# Patient Record
Sex: Female | Born: 1990 | Race: White | Hispanic: No | Marital: Married | Smoking: Former smoker
Health system: Southern US, Community
[De-identification: ages and names within clinical notes are randomized; demographics above are authoritative.]

## PROBLEM LIST (undated history)

## (undated) ENCOUNTER — Inpatient Hospital Stay (HOSPITAL_COMMUNITY): Payer: Self-pay

## (undated) DIAGNOSIS — F32A Depression, unspecified: Secondary | ICD-10-CM

## (undated) DIAGNOSIS — F329 Major depressive disorder, single episode, unspecified: Secondary | ICD-10-CM

## (undated) DIAGNOSIS — F419 Anxiety disorder, unspecified: Secondary | ICD-10-CM

## (undated) DIAGNOSIS — N809 Endometriosis, unspecified: Secondary | ICD-10-CM

## (undated) HISTORY — PX: DIAGNOSTIC LAPAROSCOPY: SUR761

## (undated) HISTORY — PX: TONSILLECTOMY: SUR1361

---
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
## 2015-05-13 ENCOUNTER — Encounter (HOSPITAL_COMMUNITY): Payer: Self-pay | Admitting: *Deleted

## 2015-05-17 NOTE — H&P (Signed)
Breanna Murray is an 25 y.o. female with h/o endometriosis (l/s 2012) and recurrent pain.  The patient has been on multiple hormonal medications including continuous OCP, Nexplanon, and Depo Lupron.  She had her Nexplanon removed in December as she was planning to start a family. Her pain has returned and she desires surgical management.  She describes previously having had cystoscopy with IC diagnosed and treated and wants to have the same performed again.    Pertinent Gynecological History: Menses: with severe dysmenorrhea Bleeding: normal Contraception: none DES exposure: unknown Blood transfusions: none Sexually transmitted diseases: no past history Previous GYN Procedures: laproscopy for endometriosis  Last mammogram: n/a Date: n/a Last pap: normal Date: 10/2014 OB History: G0  Menstrual History: Menarche age: n/a Patient's last menstrual period was 04/11/2015 (exact date).    Past Medical History  Diagnosis Date  . Anxiety   . Depression     Past Surgical History  Procedure Laterality Date  . Tonsillectomy    . Diagnostic laparoscopy      History reviewed. No pertinent family history.  Social History:  reports that she quit smoking about 5 years ago. Her smoking use included Cigarettes. She has never used smokeless tobacco. She reports that she drinks alcohol. She reports that she does not use illicit drugs.  Allergies:  Allergies  Allergen Reactions  . Metaxalone Hives  . Compazine [Prochlorperazine Edisylate] Anxiety    Jumping out of body   . Dexamethasone Anxiety    No prescriptions prior to admission    Review of Systems  Gastrointestinal: Positive for abdominal pain.    Height 5\' 5"  (1.651 m), weight 63.504 kg (140 lb), last menstrual period 04/11/2015. Physical Exam  Constitutional: She is oriented to person, place, and time. She appears well-developed and well-nourished.  Cardiovascular: Normal rate and regular rhythm.   Respiratory: Effort normal  and breath sounds normal.  GI: Soft. There is no rebound and no guarding.  Neurological: She is alert and oriented to person, place, and time.  Skin: Skin is warm and dry.  Psychiatric: She has a normal mood and affect. Her behavior is normal.    No results found for this or any previous visit (from the past 24 hour(s)).  No results found.  Assessment/Plan: 25yo with h/o endometriosis and recurrent pelvic pain -Dx L/S, removal of endometriosis, chromopertubation, cysto with bladder instillation -Patient is counseled re: risk of bleeding, infection, scarring and damage to surrounding structures.  All questions were answered and the patient wishes to proceed.  Lani Havlik 05/17/2015, 1:46 PM

## 2015-05-22 ENCOUNTER — Ambulatory Visit (HOSPITAL_COMMUNITY): Payer: PRIVATE HEALTH INSURANCE | Admitting: Anesthesiology

## 2015-05-22 ENCOUNTER — Ambulatory Visit (HOSPITAL_COMMUNITY)
Admission: RE | Admit: 2015-05-22 | Discharge: 2015-05-22 | Disposition: A | Discharge status: Home / Self Care | Payer: PRIVATE HEALTH INSURANCE | Source: Ambulatory Visit | Attending: Obstetrics & Gynecology | Admitting: Obstetrics & Gynecology

## 2015-05-22 ENCOUNTER — Encounter (HOSPITAL_COMMUNITY): Payer: Self-pay | Admitting: Emergency Medicine

## 2015-05-22 ENCOUNTER — Encounter (HOSPITAL_COMMUNITY)
Admission: RE | Disposition: A | Discharge status: Home / Self Care | Payer: Self-pay | Source: Ambulatory Visit | Attending: Obstetrics & Gynecology

## 2015-05-22 DIAGNOSIS — N803 Endometriosis of pelvic peritoneum: Secondary | ICD-10-CM | POA: Insufficient documentation

## 2015-05-22 DIAGNOSIS — F418 Other specified anxiety disorders: Secondary | ICD-10-CM | POA: Diagnosis not present

## 2015-05-22 DIAGNOSIS — N946 Dysmenorrhea, unspecified: Secondary | ICD-10-CM | POA: Diagnosis not present

## 2015-05-22 DIAGNOSIS — R102 Pelvic and perineal pain: Secondary | ICD-10-CM | POA: Insufficient documentation

## 2015-05-22 DIAGNOSIS — N3010 Interstitial cystitis (chronic) without hematuria: Secondary | ICD-10-CM | POA: Insufficient documentation

## 2015-05-22 DIAGNOSIS — Z87891 Personal history of nicotine dependence: Secondary | ICD-10-CM | POA: Diagnosis not present

## 2015-05-22 DIAGNOSIS — N809 Endometriosis, unspecified: Secondary | ICD-10-CM | POA: Diagnosis present

## 2015-05-22 HISTORY — PX: CHROMOPERTUBATION: SHX6288

## 2015-05-22 HISTORY — DX: Anxiety disorder, unspecified: F41.9

## 2015-05-22 HISTORY — DX: Depression, unspecified: F32.A

## 2015-05-22 HISTORY — DX: Major depressive disorder, single episode, unspecified: F32.9

## 2015-05-22 HISTORY — PX: LAPAROSCOPY: SHX197

## 2015-05-22 LAB — CBC
HCT: 43.1 % (ref 36.0–46.0)
HEMOGLOBIN: 15 g/dL (ref 12.0–15.0)
MCH: 31.1 pg (ref 26.0–34.0)
MCHC: 34.8 g/dL (ref 30.0–36.0)
MCV: 89.2 fL (ref 78.0–100.0)
Platelets: 320 10*3/uL (ref 150–400)
RBC: 4.83 MIL/uL (ref 3.87–5.11)
RDW: 12.5 % (ref 11.5–15.5)
WBC: 5.6 10*3/uL (ref 4.0–10.5)

## 2015-05-22 LAB — PREGNANCY, URINE: PREG TEST UR: NEGATIVE

## 2015-05-22 SURGERY — LAPAROSCOPY, DIAGNOSTIC
Anesthesia: General | Site: Abdomen

## 2015-05-22 MED ORDER — CEFAZOLIN SODIUM-DEXTROSE 2-4 GM/100ML-% IV SOLN
2.0000 g | INTRAVENOUS | Status: AC
  Administered 2015-05-22: 2 g via INTRAVENOUS
  Filled 2015-05-22: qty 100

## 2015-05-22 MED ORDER — BUPIVACAINE HCL (PF) 0.25 % IJ SOLN
INTRAMUSCULAR | Status: AC
  Filled 2015-05-22: qty 30

## 2015-05-22 MED ORDER — NEOSTIGMINE METHYLSULFATE 10 MG/10ML IV SOLN
INTRAVENOUS | Status: AC
  Filled 2015-05-22: qty 1

## 2015-05-22 MED ORDER — FENTANYL CITRATE (PF) 250 MCG/5ML IJ SOLN
INTRAMUSCULAR | Status: AC
  Filled 2015-05-22: qty 5

## 2015-05-22 MED ORDER — ONDANSETRON HCL 4 MG/2ML IJ SOLN
INTRAMUSCULAR | Status: AC
  Filled 2015-05-22: qty 2

## 2015-05-22 MED ORDER — LACTATED RINGERS IR SOLN
Status: DC | PRN
Start: 2015-05-22 — End: 2015-05-22
  Administered 2015-05-22: 3000 mL

## 2015-05-22 MED ORDER — HYDROMORPHONE HCL 1 MG/ML IJ SOLN
INTRAMUSCULAR | Status: AC
  Administered 2015-05-22: 0.5 mg via INTRAVENOUS
  Filled 2015-05-22: qty 1

## 2015-05-22 MED ORDER — HEPARIN SODIUM (PORCINE) 5000 UNIT/ML IJ SOLN
Freq: Once | INTRAMUSCULAR | Status: DC
  Filled 2015-05-22: qty 2

## 2015-05-22 MED ORDER — FENTANYL CITRATE (PF) 100 MCG/2ML IJ SOLN
INTRAMUSCULAR | Status: DC | PRN
  Administered 2015-05-22: 100 ug via INTRAVENOUS
  Administered 2015-05-22 (×3): 50 ug via INTRAVENOUS
  Administered 2015-05-22: 100 ug via INTRAVENOUS

## 2015-05-22 MED ORDER — GLYCOPYRROLATE 0.2 MG/ML IJ SOLN
INTRAMUSCULAR | Status: AC
  Filled 2015-05-22: qty 2

## 2015-05-22 MED ORDER — MIDAZOLAM HCL 5 MG/5ML IJ SOLN
INTRAMUSCULAR | Status: DC | PRN
  Administered 2015-05-22: 2 mg via INTRAVENOUS

## 2015-05-22 MED ORDER — SODIUM CHLORIDE 0.9 % IJ SOLN
INTRAMUSCULAR | Status: DC | PRN
  Administered 2015-05-22: 10 mL

## 2015-05-22 MED ORDER — SODIUM CHLORIDE 0.9 % IJ SOLN
INTRAMUSCULAR | Status: AC
  Filled 2015-05-22: qty 10

## 2015-05-22 MED ORDER — HYDROMORPHONE HCL 1 MG/ML IJ SOLN
0.2500 mg | INTRAMUSCULAR | Status: DC | PRN
  Administered 2015-05-22 (×2): 0.5 mg via INTRAVENOUS

## 2015-05-22 MED ORDER — LIDOCAINE HCL (CARDIAC) 20 MG/ML IV SOLN
INTRAVENOUS | Status: AC
  Filled 2015-05-22: qty 5

## 2015-05-22 MED ORDER — OXYCODONE HCL 5 MG PO TABS
5.0000 mg | ORAL_TABLET | Freq: Once | ORAL | Status: AC | PRN
  Administered 2015-05-22: 5 mg via ORAL

## 2015-05-22 MED ORDER — PROPOFOL 10 MG/ML IV BOLUS
INTRAVENOUS | Status: AC
  Filled 2015-05-22: qty 20

## 2015-05-22 MED ORDER — METHYLENE BLUE 1 % INJ SOLN
INTRAMUSCULAR | Status: AC
  Filled 2015-05-22: qty 1

## 2015-05-22 MED ORDER — PROPOFOL 10 MG/ML IV BOLUS
INTRAVENOUS | Status: DC | PRN
  Administered 2015-05-22: 200 mg via INTRAVENOUS

## 2015-05-22 MED ORDER — STERILE WATER FOR IRRIGATION IR SOLN
Status: DC | PRN
  Administered 2015-05-22: 60 mL

## 2015-05-22 MED ORDER — FENTANYL CITRATE (PF) 100 MCG/2ML IJ SOLN
INTRAMUSCULAR | Status: AC
  Filled 2015-05-22: qty 2

## 2015-05-22 MED ORDER — ONDANSETRON HCL 4 MG/2ML IJ SOLN
INTRAMUSCULAR | Status: DC | PRN
  Administered 2015-05-22: 4 mg via INTRAVENOUS

## 2015-05-22 MED ORDER — LACTATED RINGERS IV SOLN
INTRAVENOUS | Status: DC
  Administered 2015-05-22 (×2): via INTRAVENOUS

## 2015-05-22 MED ORDER — KETOROLAC TROMETHAMINE 30 MG/ML IJ SOLN
INTRAMUSCULAR | Status: AC
  Filled 2015-05-22: qty 1

## 2015-05-22 MED ORDER — LIDOCAINE HCL (CARDIAC) 20 MG/ML IV SOLN
INTRAVENOUS | Status: DC | PRN
  Administered 2015-05-22: 100 mg via INTRAVENOUS

## 2015-05-22 MED ORDER — LACTATED RINGERS IV SOLN: INTRAVENOUS | Status: DC

## 2015-05-22 MED ORDER — OXYCODONE HCL 5 MG PO TABS
ORAL_TABLET | ORAL | Status: AC
  Filled 2015-05-22: qty 1

## 2015-05-22 MED ORDER — OXYCODONE-ACETAMINOPHEN 5-325 MG PO TABS: 1.0000 | ORAL_TABLET | ORAL | Status: DC | PRN

## 2015-05-22 MED ORDER — ROCURONIUM BROMIDE 100 MG/10ML IV SOLN
INTRAVENOUS | Status: DC | PRN
  Administered 2015-05-22: 40 mg via INTRAVENOUS

## 2015-05-22 MED ORDER — IBUPROFEN 800 MG PO TABS: 800.0000 mg | ORAL_TABLET | Freq: Four times a day (QID) | ORAL | Status: DC | PRN

## 2015-05-22 MED ORDER — ACETAMINOPHEN 325 MG PO TABS: 325.0000 mg | ORAL_TABLET | ORAL | Status: DC | PRN

## 2015-05-22 MED ORDER — CEFAZOLIN SODIUM-DEXTROSE 2-3 GM-% IV SOLR
INTRAVENOUS | Status: AC
  Filled 2015-05-22: qty 50

## 2015-05-22 MED ORDER — BUPIVACAINE HCL (PF) 0.25 % IJ SOLN
INTRAMUSCULAR | Status: DC | PRN
  Administered 2015-05-22: 8 mL

## 2015-05-22 MED ORDER — SCOPOLAMINE 1 MG/3DAYS TD PT72
1.0000 | MEDICATED_PATCH | Freq: Once | TRANSDERMAL | Status: DC
  Administered 2015-05-22: 1.5 mg via TRANSDERMAL

## 2015-05-22 MED ORDER — KETOROLAC TROMETHAMINE 30 MG/ML IJ SOLN
INTRAMUSCULAR | Status: DC | PRN
  Administered 2015-05-22: 30 mg via INTRAVENOUS

## 2015-05-22 MED ORDER — ACETAMINOPHEN 160 MG/5ML PO SOLN: 325.0000 mg | ORAL | Status: DC | PRN

## 2015-05-22 MED ORDER — MIDAZOLAM HCL 2 MG/2ML IJ SOLN
INTRAMUSCULAR | Status: AC
  Filled 2015-05-22: qty 2

## 2015-05-22 MED ORDER — OXYCODONE HCL 5 MG/5ML PO SOLN: 5.0000 mg | Freq: Once | ORAL | Status: AC | PRN

## 2015-05-22 MED ORDER — ROCURONIUM BROMIDE 100 MG/10ML IV SOLN
INTRAVENOUS | Status: AC
  Filled 2015-05-22: qty 1

## 2015-05-22 MED ORDER — SCOPOLAMINE 1 MG/3DAYS TD PT72
MEDICATED_PATCH | TRANSDERMAL | Status: AC
  Administered 2015-05-22: 1.5 mg via TRANSDERMAL
  Filled 2015-05-22: qty 1

## 2015-05-22 MED ORDER — NEOSTIGMINE METHYLSULFATE 10 MG/10ML IV SOLN
INTRAVENOUS | Status: DC | PRN
  Administered 2015-05-22: 3 mg via INTRAVENOUS

## 2015-05-22 MED ORDER — GLYCOPYRROLATE 0.2 MG/ML IJ SOLN
INTRAMUSCULAR | Status: DC | PRN
  Administered 2015-05-22: 0.4 mg via INTRAVENOUS

## 2015-05-22 MED ORDER — METHYLPREDNISOLONE SODIUM SUCC 125 MG IJ SOLR
INTRAMUSCULAR | Status: DC | PRN
  Administered 2015-05-22: 125 mg

## 2015-05-22 SURGICAL SUPPLY — 33 items
BARRIER ADHS 3X4 INTERCEED (GAUZE/BANDAGES/DRESSINGS) IMPLANT
CABLE HIGH FREQUENCY MONO STRZ (ELECTRODE) ×5 IMPLANT
CATH ROBINSON RED A/P 16FR (CATHETERS) ×15 IMPLANT
CHLORAPREP W/TINT 26ML (MISCELLANEOUS) ×5 IMPLANT
CLOSURE WOUND 1/2 X4 (GAUZE/BANDAGES/DRESSINGS)
CLOTH BEACON ORANGE TIMEOUT ST (SAFETY) ×5 IMPLANT
DRSG COVADERM PLUS 2X2 (GAUZE/BANDAGES/DRESSINGS) ×10 IMPLANT
DRSG OPSITE POSTOP 3X4 (GAUZE/BANDAGES/DRESSINGS) ×5 IMPLANT
FORCEPS CUTTING 33CM 5MM (CUTTING FORCEPS) IMPLANT
GLOVE BIO SURGEON STRL SZ 6 (GLOVE) ×5 IMPLANT
GLOVE BIOGEL PI IND STRL 6 (GLOVE) ×6 IMPLANT
GLOVE BIOGEL PI IND STRL 7.0 (GLOVE) ×3 IMPLANT
GLOVE BIOGEL PI INDICATOR 6 (GLOVE) ×4
GLOVE BIOGEL PI INDICATOR 7.0 (GLOVE) ×2
GOWN STRL REUS W/TWL LRG LVL3 (GOWN DISPOSABLE) ×15 IMPLANT
LIQUID BAND (GAUZE/BANDAGES/DRESSINGS) ×5 IMPLANT
NEEDLE INSUFFLATION 120MM (ENDOMECHANICALS) ×5 IMPLANT
NS IRRIG 1000ML POUR BTL (IV SOLUTION) ×5 IMPLANT
PACK LAPAROSCOPY BASIN (CUSTOM PROCEDURE TRAY) IMPLANT
PAD TRENDELENBURG POSITION (MISCELLANEOUS) ×5 IMPLANT
POUCH SPECIMEN RETRIEVAL 10MM (ENDOMECHANICALS) IMPLANT
SET CYSTO W/LG BORE CLAMP LF (SET/KITS/TRAYS/PACK) ×5 IMPLANT
SET IRRIG TUBING LAPAROSCOPIC (IRRIGATION / IRRIGATOR) ×5 IMPLANT
SLEEVE XCEL OPT CAN 5 100 (ENDOMECHANICALS) ×5 IMPLANT
STRIP CLOSURE SKIN 1/2X4 (GAUZE/BANDAGES/DRESSINGS) IMPLANT
SUT MNCRL AB 3-0 PS2 27 (SUTURE) ×5 IMPLANT
SUT VICRYL 0 UR6 27IN ABS (SUTURE) ×5 IMPLANT
SYRINGE IRR TOOMEY STRL 70CC (SYRINGE) ×5 IMPLANT
TOWEL OR 17X24 6PK STRL BLUE (TOWEL DISPOSABLE) ×10 IMPLANT
TROCAR XCEL NON-BLD 11X100MML (ENDOMECHANICALS) ×5 IMPLANT
TROCAR XCEL NON-BLD 5MMX100MML (ENDOMECHANICALS) ×5 IMPLANT
WARMER LAPAROSCOPE (MISCELLANEOUS) ×5 IMPLANT
WATER STERILE IRR 1000ML POUR (IV SOLUTION) ×5 IMPLANT

## 2015-05-22 NOTE — Progress Notes (Signed)
No change to H&P.  Will not do cystoscopy as patient has previous dx of IC.  Will plan to do bladder instillation.    Mitchel HonourMegan Dailyn Kempner, DO

## 2015-05-22 NOTE — Op Note (Signed)
Milon DikesHayden G Appleman 05/22/2015 PREOPERATIVE DIAGNOSIS:  History of endometriosis and interstitial cystitis, recurrent pain  POSTOPERATIVE DIAGNOSIS:  SAA  PROCEDURE:  Laparoscopic removal of endometriosis, chromopertubation, bladder instillation  ANESTHESIA:  General endotracheal  COMPLICATIONS:  None immediate.  PATHOLOGY:  Peritoneal biopsy  ESTIMATED BLOOD LOSS:  Less than 20 ml.  INDICATIONS: 25 y.o.  with history of endometriosis and interstitial cystitis and recurrent pelvic pain.      FINDINGS:  Normal uterus, bilateral fallopian tubes, and ovaries.  Normal appendix and gallbladder edge.  Gunpowder endometriosis implants on peritoneum overlying bladder, left uterosacral ligament and left utero-ovarian fossa; all superficial.  Bilateral spill of dye from fallopian tubes.  TECHNIQUE:  The patient was taken to the operating room where general anesthesia was obtained without difficulty.  She was then placed in the dorsal lithotomy position and prepared and draped in sterile fashion.  After an adequate timeout was performed, a bivalved speculum was then placed in the patient's vagina, and the anterior lip of cervix grasped with the single-tooth tenaculum.  The acorn uterine manipulator was advanced into the uterus.  The speculum was removed from the vagina.  The bladder was drained using a red rubber catheter of approximately 25cc of clear urine.  The bladder instillation solution was prepared by the pharmacy of  1 percent lidocaine 20 cc, 125 mg Solu-Medrol (2 cc solution), 10,000 units heparin (1000 u/ml, 10 ml vial to total 10,000 units), and 8.4 percent sodium bicarbonate 20 cc.This solution was instilled into the bladder via a catheter.  The solution was drained after 30 minutes.  No hematuria was noted.    Attention was then turned to the patient's abdomen where a 10-mm skin incision was made on the umbilical fold.  The Veress needle was carefully introduced into the peritoneal cavity  through the abdominal wall.  Intraperitoneal placement was confirmed by drop in intraabdominal pressure with insufflation of carbon dioxide gas.  Adequate pneumoperitoneum was obtained, and the 10mm trocar was then advanced without difficulty into the abdomen where intraabdominal placement was confirmed by the operative laparoscope.  The above listed findings were noted.  A second incision was made in the suprapubic region measuring 5mm.  The 5mm trocar was advanced under direct visualization of the scope.  Using a biopsy forceps, a biopsy was obtained from the bladder peritoneum.  The biopsy site was rendered hemostatic using monopolar shears.  The remaining lesions were cauterized using the monopolar shears.    Attention was then turned to the chromopertubation.  A dilute solution of methylene blue was pushed through the uterine manipulation while watching the fallopian tubes laparoscopically.  Spill bilaterally was observed.  Using a suction irrigator, the spilled dye and was removed from the pelvis.  Instruments were removed from the abdomen and trocars were removed as well.  The infraumbilical fascial incision was closed with 0 Vicryl in a figure of eight stitch.  The skin incisions were closed with 3-0 monocryl in subcuticular fashion.  Dermabond was applied   The uterine manipulator and the tenaculum were removed from the vagina without complications. The patient tolerated the procedure well.  Sponge, lap, and needle counts were correct times two.  The patient was then taken to the recovery room awake, extubated and in stable  in stable condition.

## 2015-05-22 NOTE — Transfer of Care (Signed)
Immediate Anesthesia Transfer of Care Note  Patient: Breanna Murray  Procedure(s) Performed: Procedure(s): LAPAROSCOPY DIAGNOSTIC, removal of endometriosis, Bladder Instillation (N/A) CHROMOPERTUBATION (Bilateral)  Patient Location: PACU  Anesthesia Type:General  Level of Consciousness: awake, alert , oriented and patient cooperative  Airway & Oxygen Therapy: Patient Spontanous Breathing and Patient connected to nasal cannula oxygen  Post-op Assessment: Report given to RN and Post -op Vital signs reviewed and stable  Post vital signs: Reviewed and stable  Last Vitals:  Filed Vitals:   05/22/15 1238  BP: 114/79  Pulse: 85  Temp: 36.8 C  Resp: 20    Complications: No apparent anesthesia complications

## 2015-05-22 NOTE — Discharge Instructions (Signed)
Call MD for T>100.4, severe abdominal pain, intractable nausea and/or vomiting, or respiratory distress.  Call office to schedule postop appointment in 2 weeks.  No driving while taking narcotics.  Pelvic rest x 2 weeks.DISCHARGE INSTRUCTIONS: Laparoscopy  The following instructions have been prepared to help you care for yourself upon your return home today.  Wound care:  Do not get the incision wet for the first 24 hours. The incision should be kept clean and dry.  The Band-Aids or dressings may be removed the day after surgery.  Should the incision become sore, red, and swollen after the first week, check with your doctor.  Personal hygiene:  Shower the day after your procedure.  Activity and limitations:  Do NOT drive or operate any equipment today.  Do NOT lift anything more than 15 pounds for 2-3 weeks after surgery.  Do NOT rest in bed all day.  Walking is encouraged. Walk each day, starting slowly with 5-minute walks 3 or 4 times a day. Slowly increase the length of your walks.  Walk up and down stairs slowly.  Do NOT do strenuous activities, such as golfing, playing tennis, bowling, running, biking, weight lifting, gardening, mowing, or vacuuming for 2-4 weeks. Ask your doctor when it is okay to start.  Diet: Eat a light meal as desired this evening. You may resume your usual diet tomorrow.  Return to work: This is dependent on the type of work you do. For the most part you can return to a desk job within a week of surgery. If you are more active at work, please discuss this with your doctor.  What to expect after your surgery: You may have a slight burning sensation when you urinate on the first day. You may have a very small amount of blood in the urine. Expect to have a small amount of vaginal discharge/light bleeding for 1-2 weeks. It is not unusual to have abdominal soreness and bruising for up to 2 weeks. You may be tired and need more rest for about 1 week. You may  experience shoulder pain for 24-72 hours. Lying flat in bed may relieve it.  Call your doctor for any of the following:  Develop a fever of 100.4 or greater  Inability to urinate 6 hours after discharge from hospital  Severe pain not relieved by pain medications  Persistent of heavy bleeding at incision site  Redness or swelling around incision site after a week  Increasing nausea or vomiting  Patient Signature________________________________________ Nurse Signature_________________________________________

## 2015-05-22 NOTE — Anesthesia Preprocedure Evaluation (Signed)
Anesthesia Evaluation  Patient identified by MRN, date of birth, ID band Patient awake    Reviewed: Allergy & Precautions, NPO status , Patient's Chart, lab work & pertinent test results  History of Anesthesia Complications Negative for: history of anesthetic complications  Airway Mallampati: I  TM Distance: >3 FB Neck ROM: Full    Dental  (+) Teeth Intact   Pulmonary neg shortness of breath, neg sleep apnea, neg COPD, neg recent URI, former smoker,    breath sounds clear to auscultation       Cardiovascular negative cardio ROS   Rhythm:Regular     Neuro/Psych PSYCHIATRIC DISORDERS Anxiety Depression    GI/Hepatic negative GI ROS, Neg liver ROS,   Endo/Other  negative endocrine ROS  Renal/GU negative Renal ROS     Musculoskeletal   Abdominal   Peds  Hematology negative hematology ROS (+)   Anesthesia Other Findings   Reproductive/Obstetrics                             Anesthesia Physical Anesthesia Plan  ASA: II  Anesthesia Plan: General   Post-op Pain Management:    Induction: Intravenous  Airway Management Planned: Oral ETT  Additional Equipment: None  Intra-op Plan:   Post-operative Plan: Extubation in OR  Informed Consent: I have reviewed the patients History and Physical, chart, labs and discussed the procedure including the risks, benefits and alternatives for the proposed anesthesia with the patient or authorized representative who has indicated his/her understanding and acceptance.   Dental advisory given  Plan Discussed with: CRNA and Surgeon  Anesthesia Plan Comments:         Anesthesia Quick Evaluation

## 2015-05-23 NOTE — Anesthesia Postprocedure Evaluation (Signed)
Anesthesia Post Note  Patient: Breanna Murray  Procedure(s) Performed: Procedure(s) (LRB): LAPAROSCOPY DIAGNOSTIC, removal of endometriosis, Bladder Instillation (N/A) CHROMOPERTUBATION (Bilateral)  Patient location during evaluation: PACU Anesthesia Type: General Level of consciousness: awake Pain management: pain level controlled Vital Signs Assessment: post-procedure vital signs reviewed and stable Respiratory status: spontaneous breathing and respiratory function stable Cardiovascular status: stable Postop Assessment: no signs of nausea or vomiting Anesthetic complications: no    Last Vitals:  Filed Vitals:   05/22/15 1635 05/22/15 1641  BP:    Pulse: 63 77  Temp:  36.9 C  Resp: 12 16    Last Pain:  Filed Vitals:   05/22/15 1644  PainSc: 3                  Lott Seelbach

## 2015-05-25 ENCOUNTER — Encounter (HOSPITAL_COMMUNITY): Payer: Self-pay | Admitting: Obstetrics & Gynecology

## 2017-01-25 ENCOUNTER — Inpatient Hospital Stay (HOSPITAL_COMMUNITY)
Admission: AD | Admit: 2017-01-25 | Discharge: 2017-01-25 | Disposition: A | Discharge status: Home / Self Care | Payer: PRIVATE HEALTH INSURANCE | Source: Ambulatory Visit | Attending: Obstetrics and Gynecology | Admitting: Obstetrics and Gynecology

## 2017-01-25 ENCOUNTER — Encounter (HOSPITAL_COMMUNITY): Payer: Self-pay | Admitting: *Deleted

## 2017-01-25 ENCOUNTER — Inpatient Hospital Stay (HOSPITAL_COMMUNITY): Payer: PRIVATE HEALTH INSURANCE

## 2017-01-25 DIAGNOSIS — Z3A16 16 weeks gestation of pregnancy: Secondary | ICD-10-CM | POA: Diagnosis not present

## 2017-01-25 DIAGNOSIS — Z79899 Other long term (current) drug therapy: Secondary | ICD-10-CM | POA: Diagnosis not present

## 2017-01-25 DIAGNOSIS — O26892 Other specified pregnancy related conditions, second trimester: Secondary | ICD-10-CM | POA: Insufficient documentation

## 2017-01-25 DIAGNOSIS — Z87891 Personal history of nicotine dependence: Secondary | ICD-10-CM | POA: Insufficient documentation

## 2017-01-25 DIAGNOSIS — O4692 Antepartum hemorrhage, unspecified, second trimester: Secondary | ICD-10-CM

## 2017-01-25 DIAGNOSIS — F329 Major depressive disorder, single episode, unspecified: Secondary | ICD-10-CM | POA: Diagnosis not present

## 2017-01-25 DIAGNOSIS — R102 Pelvic and perineal pain: Secondary | ICD-10-CM | POA: Diagnosis not present

## 2017-01-25 DIAGNOSIS — F419 Anxiety disorder, unspecified: Secondary | ICD-10-CM | POA: Insufficient documentation

## 2017-01-25 DIAGNOSIS — Z888 Allergy status to other drugs, medicaments and biological substances status: Secondary | ICD-10-CM | POA: Insufficient documentation

## 2017-01-25 DIAGNOSIS — O209 Hemorrhage in early pregnancy, unspecified: Secondary | ICD-10-CM | POA: Diagnosis not present

## 2017-01-25 DIAGNOSIS — N939 Abnormal uterine and vaginal bleeding, unspecified: Secondary | ICD-10-CM | POA: Diagnosis present

## 2017-01-25 HISTORY — DX: Endometriosis, unspecified: N80.9

## 2017-01-25 LAB — URINALYSIS, ROUTINE W REFLEX MICROSCOPIC
Bilirubin Urine: NEGATIVE
GLUCOSE, UA: NEGATIVE mg/dL
KETONES UR: NEGATIVE mg/dL
Leukocytes, UA: NEGATIVE
NITRITE: NEGATIVE
PROTEIN: 30 mg/dL — AB
Specific Gravity, Urine: 1.023 (ref 1.005–1.030)
pH: 7 (ref 5.0–8.0)

## 2017-01-25 LAB — CBC
HCT: 40.2 % (ref 36.0–46.0)
HEMOGLOBIN: 13.4 g/dL (ref 12.0–15.0)
MCH: 29 pg (ref 26.0–34.0)
MCHC: 33.3 g/dL (ref 30.0–36.0)
MCV: 87 fL (ref 78.0–100.0)
Platelets: 259 10*3/uL (ref 150–400)
RBC: 4.62 MIL/uL (ref 3.87–5.11)
RDW: 12.7 % (ref 11.5–15.5)
WBC: 6.8 10*3/uL (ref 4.0–10.5)

## 2017-01-25 LAB — WET PREP, GENITAL
Clue Cells Wet Prep HPF POC: NONE SEEN
SPERM: NONE SEEN
TRICH WET PREP: NONE SEEN
YEAST WET PREP: NONE SEEN

## 2017-01-25 NOTE — MAU Provider Note (Signed)
History     CSN: [id number]  Arrival date and time: 01/25/17 [id number]   First Provider Initiated Contact with Patient 01/25/17 0825      Chief Complaint  Patient presents with  . Vaginal Bleeding   Breanna Murray is a 26 y.o. G1P0 at 5276w3d who presents today with vaginal bleeding. Denies complications with the pregnancy. Next OB visit 02/10/17.    Vaginal Bleeding  The patient's primary symptoms include pelvic pain and vaginal bleeding. This is a new problem. The current episode started today (0500). The problem occurs constantly. The problem has been gradually improving. Pain severity now: 2/10. The problem affects both sides. She is pregnant. Pertinent negatives include no chills, fever, nausea or vomiting. The vaginal bleeding is heavier than menses. She has been passing clots. She has not been passing tissue. Nothing aggravates the symptoms. She has tried nothing for the symptoms. She is sexually active (Last intercourse 01/22/17). No, her partner does not have an STD.    Past Medical History:  Diagnosis Date  . Anxiety   . Depression   . Endometriosis     Past Surgical History:  Procedure Laterality Date  . CHROMOPERTUBATION Bilateral 05/22/2015   Procedure: CHROMOPERTUBATION;  Surgeon: Mitchel HonourMegan Morris, DO;  Location: [facility];  Service: Gynecology;  Laterality: Bilateral;  . DIAGNOSTIC LAPAROSCOPY    . LAPAROSCOPY N/A 05/22/2015   Procedure: LAPAROSCOPY DIAGNOSTIC, removal of endometriosis, Bladder Instillation;  Surgeon: Mitchel HonourMegan Morris, DO;  Location: [facility];  Service: Gynecology;  Laterality: N/A;  . TONSILLECTOMY      History reviewed. No pertinent family history.  Social History   Tobacco Use  . Smoking status: Former Smoker    Types: Cigarettes    Last attempt to quit: 05/13/2010    Years since quitting: 6.7  . Smokeless tobacco: Never Used  Substance Use Topics  . Alcohol use: Yes    Comment: 2 drinks per week  . Drug use: No    Allergies:  Allergies  Allergen  Reactions  . Metaxalone Hives  . Compazine [Prochlorperazine Edisylate] Anxiety    Jumping out of body   . Dexamethasone Anxiety    Medications Prior to Admission  Medication Sig Dispense Refill Last Dose  . acidophilus (RISAQUAD) CAPS capsule Take 1 capsule by mouth daily.     Marland Kitchen. escitalopram (LEXAPRO) 10 MG tablet Take 10 mg by mouth daily.     Marland Kitchen. ibuprofen (ADVIL,MOTRIN) 800 MG tablet Take 1 tablet (800 mg total) by mouth every 6 (six) hours as needed. 30 tablet 1   . oxyCODONE-acetaminophen (ROXICET) 5-325 MG tablet Take 1-2 tablets by mouth every 4 (four) hours as needed for severe pain. 20 tablet 0     Review of Systems  Constitutional: Negative for chills and fever.  Gastrointestinal: Negative for nausea and vomiting.  Genitourinary: Positive for pelvic pain and vaginal bleeding.   Physical Exam   Blood pressure 126/82, pulse 88, temperature 98.3 F (36.8 C), temperature source Oral, resp. rate 15, height 5\' 5"  (1.651 m), weight 161 lb (73 kg), SpO2 100 %.  Physical Exam  Nursing note and vitals reviewed. Constitutional: She is oriented to person, place, and time. She appears well-developed and well-nourished. No distress.  HENT:  Head: Normocephalic.  Cardiovascular: Normal rate.  Respiratory: Effort normal.  GI: Soft. There is no tenderness. There is no rebound.  Genitourinary:  Genitourinary Comments:  External: no lesion Vagina: small amount of bright red blood in the vagina  Cervix: pink, smooth, visually closed, no  active bleeding noted. Closed by digital exam  Uterus: AGA, FHT 152 with doppler   Neurological: She is alert and oriented to person, place, and time.  Skin: Skin is warm and dry.  Psychiatric: She has a normal mood and affect.   Results for orders placed or performed during the hospital encounter of 01/25/17 (from the past 24 hour(s))  Urinalysis, Routine w reflex microscopic     Status: Abnormal   Collection Time: 01/25/17  8:05 AM  Result Value  Ref Range   Color, Urine YELLOW YELLOW   APPearance CLOUDY (A) CLEAR   Specific Gravity, Urine 1.023 1.005 - 1.030   pH 7.0 5.0 - 8.0   Glucose, UA NEGATIVE NEGATIVE mg/dL   Hgb urine dipstick LARGE (A) NEGATIVE   Bilirubin Urine NEGATIVE NEGATIVE   Ketones, ur NEGATIVE NEGATIVE mg/dL   Protein, ur 30 (A) NEGATIVE mg/dL   Nitrite NEGATIVE NEGATIVE   Leukocytes, UA NEGATIVE NEGATIVE   RBC / HPF 6-30 0 - 5 RBC/hpf   WBC, UA 0-5 0 - 5 WBC/hpf   Bacteria, UA RARE (A) NONE SEEN   Squamous Epithelial / LPF 6-30 (A) NONE SEEN   Mucus PRESENT   CBC     Status: None   Collection Time: 01/25/17  8:34 AM  Result Value Ref Range   WBC 6.8 4.0 - 10.5 K/uL   RBC 4.62 3.87 - 5.11 MIL/uL   Hemoglobin 13.4 12.0 - 15.0 g/dL   HCT 16.140.2 09.636.0 - 04.546.0 %   MCV 87.0 78.0 - 100.0 fL   MCH 29.0 26.0 - 34.0 pg   MCHC 33.3 30.0 - 36.0 g/dL   RDW 40.912.7 81.111.5 - 91.415.5 %   Platelets 259 150 - 400 K/uL  Wet prep, genital     Status: Abnormal   Collection Time: 01/25/17  8:44 AM  Result Value Ref Range   Yeast Wet Prep HPF POC NONE SEEN NONE SEEN   Trich, Wet Prep NONE SEEN NONE SEEN   Clue Cells Wet Prep HPF POC NONE SEEN NONE SEEN   WBC, Wet Prep HPF POC FEW (A) NONE SEEN   Sperm NONE SEEN    US: Placenta, anterior, above OS.  Cervix 3.91cm AFI WNL    MAU Course  Procedures  MDM O+ blood type per Dr. Renaldo FiddlerAdkins 1034: DW Dr. Renaldo FiddlerAdkins, ok for DC home, FU in the office on Friday   Assessment and Plan   1. Vaginal bleeding in pregnancy, second trimester   2. [redacted] weeks gestation of pregnancy    DC home Comfort measures reviewed  2nd Trimester precautions  Bleeding precautions RX: none  Return to MAU as needed FU with OB as planned  Follow-up Information    Zelphia CairoAdkins, Gretchen, MD Follow up.   Specialty:  Obstetrics and Gynecology Why:  Call the office for an appointment on Friday  Contact information: [address], [address] [place] [id number] [phone]            Breanna Murray 01/25/2017, 8:35 AM

## 2017-01-25 NOTE — Discharge Instructions (Signed)
Pelvic Rest °Pelvic rest may be recommended if: °· Your placenta is partially or completely covering the opening of your cervix (placenta previa). °· There is bleeding between the wall of the uterus and the amniotic sac in the first trimester of pregnancy (subchorionic hemorrhage). °· You went into labor too early (preterm labor). ° °Based on your overall health and the health of your baby, your health care provider will decide if pelvic rest is right for you. °How do I rest my pelvis? °For as long as told by your health care provider: °· Do not have sex, sexual stimulation, or an orgasm. °· Do not use tampons. Do not douche. Do not put anything in your vagina. °· Do not lift anything that is heavier than 10 lb (4.5 kg). °· Avoid activities that take a lot of effort (are strenuous). °· Avoid any activity in which your pelvic muscles could become strained. ° °When should I seek medical care? °Seek medical care if you have: °· Cramping pain in your lower abdomen. °· Vaginal discharge. °· A low, dull backache. °· Regular contractions. °· Uterine tightening. ° °When should I seek immediate medical care? °Seek immediate medical care if: °· You have vaginal bleeding and you are pregnant. ° °This information is not intended to replace advice given to you by your health care provider. Make sure you discuss any questions you have with your health care provider. °Document Released: 06/11/2010 Document Revised: 07/23/2015 Document Reviewed: 08/18/2014 °Elsevier Interactive Patient Education © 2018 [employer] ° °

## 2017-01-25 NOTE — MAU Note (Signed)
Pt reports she started having heavy bleeding at 5 am today, bleeding has continued. Mild cramping.

## 2017-01-26 LAB — GC/CHLAMYDIA PROBE AMP (~~LOC~~) NOT AT ARMC
CHLAMYDIA, DNA PROBE: NEGATIVE
NEISSERIA GONORRHEA: NEGATIVE

## 2017-02-22 ENCOUNTER — Other Ambulatory Visit (HOSPITAL_COMMUNITY): Payer: Self-pay | Admitting: Obstetrics & Gynecology

## 2017-02-22 DIAGNOSIS — Z3689 Encounter for other specified antenatal screening: Secondary | ICD-10-CM

## 2017-02-22 DIAGNOSIS — R772 Abnormality of alphafetoprotein: Secondary | ICD-10-CM

## 2017-02-22 DIAGNOSIS — Z3A21 21 weeks gestation of pregnancy: Secondary | ICD-10-CM

## 2017-02-27 ENCOUNTER — Encounter (HOSPITAL_COMMUNITY): Payer: Self-pay | Admitting: Family Medicine

## 2017-03-01 ENCOUNTER — Encounter (HOSPITAL_COMMUNITY): Payer: Self-pay

## 2017-03-02 ENCOUNTER — Encounter (HOSPITAL_COMMUNITY): Payer: Self-pay

## 2017-03-02 ENCOUNTER — Ambulatory Visit (HOSPITAL_COMMUNITY)
Admission: RE | Admit: 2017-03-02 | Discharge: 2017-03-02 | Disposition: A | Discharge status: Home / Self Care | Payer: PRIVATE HEALTH INSURANCE | Source: Ambulatory Visit | Attending: Obstetrics and Gynecology | Admitting: Obstetrics and Gynecology

## 2017-03-02 DIAGNOSIS — Z3A21 21 weeks gestation of pregnancy: Secondary | ICD-10-CM | POA: Diagnosis not present

## 2017-03-02 DIAGNOSIS — Z363 Encounter for antenatal screening for malformations: Secondary | ICD-10-CM | POA: Insufficient documentation

## 2017-03-02 DIAGNOSIS — R772 Abnormality of alphafetoprotein: Secondary | ICD-10-CM

## 2017-03-02 DIAGNOSIS — O09812 Supervision of pregnancy resulting from assisted reproductive technology, second trimester: Secondary | ICD-10-CM | POA: Insufficient documentation

## 2017-03-02 DIAGNOSIS — Z3689 Encounter for other specified antenatal screening: Secondary | ICD-10-CM

## 2017-03-02 DIAGNOSIS — O289 Unspecified abnormal findings on antenatal screening of mother: Secondary | ICD-10-CM | POA: Insufficient documentation

## 2017-03-02 DIAGNOSIS — O280 Abnormal hematological finding on antenatal screening of mother: Secondary | ICD-10-CM | POA: Insufficient documentation

## 2017-03-02 NOTE — Progress Notes (Signed)
Genetic Counseling  High-Risk Gestation Note  Appointment Date:  03/02/2017 Referred By: Zelphia Cairo, MD Date of Birth:  1991/01/15 Partner:  Donnie Mesa   Pregnancy History: G1P0 Estimated Date of Delivery: 07/09/17 Estimated Gestational Age: [redacted]w[redacted]d   Mrs. Milon Dikes and her husband, Mr. Luv Mish, were seen for consultation for genetic counseling because of an elevated MSAFP of 2.68 MoMs based on maternal serum screening through [employer].    In summary:  Discussed elevated MSAFP   Reviewed possible explanations for elevation  Discussed additional options  Ultrasound- WNL  Amniocentesis- declined  Discussed associations with unexplained elevated MSAFP and option of follow-up ultrasound in third trimester to assess fetal growth  Reviewed family history concerns  We reviewed Mrs. Ahsha Hinsley Gaspari's maternal serum screening result, the elevation of MSAFP, and the associated 1 in 206 risk for a fetal open neural tube defect.  We reviewed open neural tube defects including: the typical multifactorial etiology and variable prognosis.  In addition, we discussed alternative explanations for an elevated MSAFP including: normal variation, twins, feto-maternal bleeding, a gestational dating error, abdominal wall defects, kidney differences, oligohydramnios, and placental problems. Ms. Cushman reported having some bleeding around the beginning of December and MSAFP was drawn 02/10/17. We discussed that this is in the differential of the underlying cause of elevated MSAFP. We also discussed that an unexplained elevation of MSAFP is associated with an increased risk for third trimester complications including: prematurity, low birth weight, and pre-eclampsia.    Detailed ultrasound was performed today. Visualized fetal anatomy was within normal limits. Complete ultrasound results reported under separate cover. We reviewed screening and diagnostic options for ONTDs including  detailed ultrasound and amniocentesis.  We discussed the risks, limitations, and benefits of each.  After thoughtful consideration of these options, Mrs. NETTYE FLEGAL declined amniocentesis, stating she was comfortable with ultrasound assessment only.  She understands that ultrasound cannot rule out all birth defects or genetic syndromes.  However, she was counseled that ~90% of fetuses with open neural tube defects can be detected by detailed second trimester ultrasound, when well visualized. Follow-up ultrasound to assess fetal growth is available in the third trimester.   Mrs. Soloway was provided with written information regarding cystic fibrosis (CF), spinal muscular atrophy (SMA) and hemoglobinopathies including the carrier frequency, availability of carrier screening and prenatal diagnosis if indicated.  In addition, we discussed that CF and hemoglobinopathies are routinely screened for as part of the [place] newborn screening panel.  Ms. Hogate reported that screening was previously performed for CF and SMA and was within normal limits.   Both family histories were reviewed and found to be contributory for type I diabetes for Mr. Helinski's father, sister, and paternal uncle. We discussed that insulin dependent diabetes is typically thought to be due to multifactorial inheritance, due to a combination of various genetic and environmental factors, many of which are not known. Recurrence risk for first degree relatives of an affected individual would be increased above the general population risk. We discussed that it is difficult to quantify recurrence risk for second degree relatives, such as the current pregnancy but that it may also be above the general population risk, though likely not as high as for that of a first degree relative. They understand that prenatal screening and testing for insulin dependent diabetes is not currently available. We discussed the importance of discussing this family history  with their pediatrician. The couple reported Caucasian ancestry, and consanguinity was denied. Without further information  regarding the provided family history, an accurate genetic risk cannot be calculated. Further genetic counseling is warranted if more information is obtained.  Mrs. Milon DikesHayden G Schiavo denied exposure to environmental toxins or chemical agents. She denied the use of alcohol, tobacco or street drugs. She denied significant viral illnesses during the course of her pregnancy. Her medical and surgical histories were noncontributory.   I counseled this couple for approximately 20 minutes regarding the above risks and available options.    Quinn PlowmanKaren Jamelle Goldston, MS,  Patent attorneyCertified Genetic Counselor

## 2017-03-08 ENCOUNTER — Encounter (HOSPITAL_COMMUNITY): Payer: Self-pay

## 2017-03-08 ENCOUNTER — Other Ambulatory Visit: Payer: Self-pay

## 2017-04-13 ENCOUNTER — Other Ambulatory Visit (HOSPITAL_COMMUNITY): Payer: Self-pay | Admitting: Obstetrics and Gynecology

## 2017-04-13 DIAGNOSIS — O283 Abnormal ultrasonic finding on antenatal screening of mother: Secondary | ICD-10-CM

## 2017-04-13 DIAGNOSIS — O280 Abnormal hematological finding on antenatal screening of mother: Secondary | ICD-10-CM

## 2017-04-13 DIAGNOSIS — Z3A28 28 weeks gestation of pregnancy: Secondary | ICD-10-CM

## 2017-04-18 ENCOUNTER — Other Ambulatory Visit (HOSPITAL_COMMUNITY): Payer: Self-pay | Admitting: *Deleted

## 2017-04-18 ENCOUNTER — Ambulatory Visit (HOSPITAL_COMMUNITY)
Admission: RE | Admit: 2017-04-18 | Discharge: 2017-04-18 | Disposition: A | Discharge status: Home / Self Care | Payer: PRIVATE HEALTH INSURANCE | Source: Ambulatory Visit | Attending: Obstetrics and Gynecology | Admitting: Obstetrics and Gynecology

## 2017-04-18 ENCOUNTER — Encounter (HOSPITAL_COMMUNITY): Payer: Self-pay

## 2017-04-18 DIAGNOSIS — Z363 Encounter for antenatal screening for malformations: Secondary | ICD-10-CM | POA: Insufficient documentation

## 2017-04-18 DIAGNOSIS — O09813 Supervision of pregnancy resulting from assisted reproductive technology, third trimester: Secondary | ICD-10-CM | POA: Diagnosis not present

## 2017-04-18 DIAGNOSIS — O281 Abnormal biochemical finding on antenatal screening of mother: Secondary | ICD-10-CM | POA: Diagnosis not present

## 2017-04-18 DIAGNOSIS — O283 Abnormal ultrasonic finding on antenatal screening of mother: Secondary | ICD-10-CM

## 2017-04-18 DIAGNOSIS — O321XX0 Maternal care for breech presentation, not applicable or unspecified: Secondary | ICD-10-CM | POA: Insufficient documentation

## 2017-04-18 DIAGNOSIS — O280 Abnormal hematological finding on antenatal screening of mother: Secondary | ICD-10-CM

## 2017-04-18 DIAGNOSIS — Z3A28 28 weeks gestation of pregnancy: Secondary | ICD-10-CM | POA: Diagnosis not present

## 2017-04-18 NOTE — Addendum Note (Signed)
Encounter addended by: Marcellina MillinMoser, Cristi Gwynn L, RTR on: 04/18/2017 4:09 PM  Actions taken: Imaging Exam ended

## 2017-05-09 ENCOUNTER — Ambulatory Visit (HOSPITAL_COMMUNITY)
Admission: RE | Admit: 2017-05-09 | Discharge: 2017-05-09 | Disposition: A | Discharge status: Home / Self Care | Payer: PRIVATE HEALTH INSURANCE | Source: Ambulatory Visit | Attending: Obstetrics and Gynecology | Admitting: Obstetrics and Gynecology

## 2017-05-09 ENCOUNTER — Encounter (HOSPITAL_COMMUNITY): Payer: Self-pay

## 2017-06-16 ENCOUNTER — Encounter (HOSPITAL_COMMUNITY)
Admission: RE | Admit: 2017-06-16 | Discharge: 2017-06-16 | Disposition: A | Discharge status: Home / Self Care | Payer: PRIVATE HEALTH INSURANCE | Source: Ambulatory Visit

## 2017-06-16 ENCOUNTER — Encounter (HOSPITAL_COMMUNITY): Payer: Self-pay

## 2017-06-16 NOTE — Pre-Procedure Instructions (Signed)
Completed phone call with instructions given for Lab Same Day.  Emailed instructions and pt verified receiving them as well.  Verbalized understanding and all questions answered.

## 2017-06-16 NOTE — Patient Instructions (Signed)
Breanna DikesHayden G Murray  06/16/2017   Your procedure is scheduled on:  06/19/2017  [facility] 0745 AM.  [facility].  Ask to see admitting.   Call this number if you have problems the morning of surgery:[phone]  Remember:   Do not eat food:(After Midnight) Desps de medianoche.  Do not drink clear liquids: (After Midnight) Desps de medianoche.  Take these medicines the morning of surgery with A SIP OF WATER: none   Do not wear jewelry, make-up or nail polish.  Do not wear lotions, powders, or perfumes. Do not wear deodorant.  Do not shave 48 hours prior to surgery.  Do not bring valuables to the hospital.  [facility] is not   responsible for any belongings or valuables brought to the hospital.  Contacts, dentures or bridgework may not be worn into surgery.  Leave suitcase in the car. After surgery it may be brought to your room.  For patients admitted to the hospital, checkout time is 11:00 AM the day of              discharge.    N/A   Please read over the following fact sheets that you were given:   Surgical Site Infection Prevention

## 2017-06-18 NOTE — H&P (Signed)
Breanna Murray is a 27 y.o. female presenting for Complicated IUGR and oligohydramnios. Unexplained MSAFP.  OB History    Gravida  1   Para      Term      Preterm      AB      Living  0     SAB      TAB      Ectopic      Multiple      Live Births             Past Medical History:  Diagnosis Date  . Anxiety   . Depression   . Endometriosis    Past Surgical History:  Procedure Laterality Date  . CHROMOPERTUBATION Bilateral 05/22/2015   Procedure: CHROMOPERTUBATION;  Surgeon: Mitchel HonourMegan Morris, DO;  Location: [facility] ORS;  Service: Gynecology;  Laterality: Bilateral;  . DIAGNOSTIC LAPAROSCOPY    . LAPAROSCOPY N/A 05/22/2015   Procedure: LAPAROSCOPY DIAGNOSTIC, removal of endometriosis, Bladder Instillation;  Surgeon: Mitchel HonourMegan Morris, DO;  Location: [facility] ORS;  Service: Gynecology;  Laterality: N/A;  . TONSILLECTOMY     Family History: family history includes Hypertension in her father and mother. Social History:  reports that she quit smoking about 7 years ago. Her smoking use included cigarettes. She has never used smokeless tobacco. She reports that she does not drink alcohol or use drugs.     Maternal Diabetes: No Genetic Screening: Normal Maternal Ultrasounds/Referrals: Normal Fetal Ultrasounds or other Referrals:  None Maternal Substance Abuse:  No Significant Maternal Medications:  None Significant Maternal Lab Results:  None Other Comments:  None  Review of Systems  Constitutional: Negative.   All other systems reviewed and are negative.  Maternal Medical History:  Contractions: Frequency: rare.   Perceived severity is mild.    Fetal activity: Perceived fetal activity is decreased.   Last perceived fetal movement was greater than 24 hours ago.    Prenatal complications: IUGR and oligohydramnios.   Prenatal Complications - Diabetes: none.      There were no vitals taken for this visit. Maternal Exam:  Uterine Assessment: Contraction strength is mild.   Contraction frequency is rare.   Abdomen: Patient reports no abdominal tenderness. Fetal presentation: breech  Introitus: Normal vulva. Normal vagina.  Ferning test: not done.  Nitrazine test: not done. Amniotic fluid character: not assessed.  Pelvis: of concern for delivery.   Cervix: Cervix evaluated by digital exam.     Physical Exam  Nursing note and vitals reviewed. Constitutional: She is oriented to person, place, and time. She appears well-developed and well-nourished.  HENT:  Head: Normocephalic and atraumatic.  Neck: Normal range of motion. Neck supple.  Cardiovascular: Normal rate and regular rhythm.  Respiratory: Effort normal and breath sounds normal.  GI: Soft. Bowel sounds are normal.  Genitourinary: Vagina normal and uterus normal.  Musculoskeletal: Normal range of motion.  Neurological: She is alert and oriented to person, place, and time. She has normal reflexes.  Skin: Skin is warm and dry.  Psychiatric: She has a normal mood and affect.    Prenatal labs: ABO, Rh:   Antibody:   Rubella:   RPR:    HBsAg:    HIV:    GBS:     Assessment/Plan: 37 weeks IUP Oligohydramnios Chronic DFM IUGR Breech. Primary Csection. TIming discussed with MFM. BMZ  Complete. Risks of surgery and possiblilty of NICU admission discussed.  Consent done.   Breanna Murray J 06/18/2017, 6:19 PM

## 2017-06-19 ENCOUNTER — Other Ambulatory Visit: Payer: Self-pay

## 2017-06-19 ENCOUNTER — Encounter (HOSPITAL_COMMUNITY)
Admission: AD | Disposition: A | Discharge status: Home / Self Care | Payer: Self-pay | Source: Ambulatory Visit | Attending: Obstetrics and Gynecology

## 2017-06-19 ENCOUNTER — Inpatient Hospital Stay (HOSPITAL_COMMUNITY)
Admission: AD | Admit: 2017-06-19 | Discharge: 2017-06-22 | DRG: 786 | Disposition: A | Discharge status: Home / Self Care | Payer: PRIVATE HEALTH INSURANCE | Source: Ambulatory Visit | Attending: Obstetrics and Gynecology | Admitting: Obstetrics and Gynecology

## 2017-06-19 ENCOUNTER — Encounter (HOSPITAL_COMMUNITY): Payer: Self-pay | Admitting: Anesthesiology

## 2017-06-19 ENCOUNTER — Inpatient Hospital Stay (HOSPITAL_COMMUNITY): Payer: PRIVATE HEALTH INSURANCE | Admitting: Certified Registered"

## 2017-06-19 DIAGNOSIS — O99214 Obesity complicating childbirth: Secondary | ICD-10-CM | POA: Diagnosis present

## 2017-06-19 DIAGNOSIS — O368130 Decreased fetal movements, third trimester, not applicable or unspecified: Secondary | ICD-10-CM | POA: Diagnosis present

## 2017-06-19 DIAGNOSIS — Z87891 Personal history of nicotine dependence: Secondary | ICD-10-CM

## 2017-06-19 DIAGNOSIS — G43909 Migraine, unspecified, not intractable, without status migrainosus: Secondary | ICD-10-CM | POA: Diagnosis present

## 2017-06-19 DIAGNOSIS — Z3A37 37 weeks gestation of pregnancy: Secondary | ICD-10-CM

## 2017-06-19 DIAGNOSIS — O365930 Maternal care for other known or suspected poor fetal growth, third trimester, not applicable or unspecified: Secondary | ICD-10-CM | POA: Diagnosis present

## 2017-06-19 DIAGNOSIS — O321XX0 Maternal care for breech presentation, not applicable or unspecified: Secondary | ICD-10-CM | POA: Diagnosis present

## 2017-06-19 DIAGNOSIS — E669 Obesity, unspecified: Secondary | ICD-10-CM | POA: Diagnosis present

## 2017-06-19 DIAGNOSIS — D62 Acute posthemorrhagic anemia: Secondary | ICD-10-CM | POA: Diagnosis not present

## 2017-06-19 DIAGNOSIS — O9089 Other complications of the puerperium, not elsewhere classified: Secondary | ICD-10-CM | POA: Diagnosis present

## 2017-06-19 DIAGNOSIS — O9081 Anemia of the puerperium: Secondary | ICD-10-CM | POA: Diagnosis not present

## 2017-06-19 DIAGNOSIS — O4103X0 Oligohydramnios, third trimester, not applicable or unspecified: Secondary | ICD-10-CM | POA: Diagnosis present

## 2017-06-19 LAB — CBC
HCT: 37.7 % (ref 36.0–46.0)
Hemoglobin: 12.9 g/dL (ref 12.0–15.0)
MCH: 28.9 pg (ref 26.0–34.0)
MCHC: 34.2 g/dL (ref 30.0–36.0)
MCV: 84.5 fL (ref 78.0–100.0)
PLATELETS: 246 10*3/uL (ref 150–400)
RBC: 4.46 MIL/uL (ref 3.87–5.11)
RDW: 13.3 % (ref 11.5–15.5)
WBC: 11.2 10*3/uL — AB (ref 4.0–10.5)

## 2017-06-19 LAB — TYPE AND SCREEN
ABO/RH(D): O POS
Antibody Screen: NEGATIVE

## 2017-06-19 LAB — ABO/RH: ABO/RH(D): O POS

## 2017-06-19 LAB — RPR: RPR: NONREACTIVE

## 2017-06-19 SURGERY — Surgical Case
Anesthesia: Spinal | Site: Abdomen | Wound class: Clean Contaminated

## 2017-06-19 MED ORDER — FENTANYL CITRATE (PF) 100 MCG/2ML IJ SOLN
25.0000 ug | INTRAMUSCULAR | Status: DC | PRN
  Administered 2017-06-19: 50 ug via INTRAVENOUS

## 2017-06-19 MED ORDER — FENTANYL CITRATE (PF) 100 MCG/2ML IJ SOLN
INTRAMUSCULAR | Status: AC
  Filled 2017-06-19: qty 2

## 2017-06-19 MED ORDER — METHYLERGONOVINE MALEATE 0.2 MG PO TABS: 0.2000 mg | ORAL_TABLET | ORAL | Status: DC | PRN

## 2017-06-19 MED ORDER — STERILE WATER FOR IRRIGATION IR SOLN
Status: DC | PRN
  Administered 2017-06-19: 1000 mL

## 2017-06-19 MED ORDER — SODIUM CHLORIDE 0.9 % IR SOLN
Status: DC | PRN
  Administered 2017-06-19: 1000 mL

## 2017-06-19 MED ORDER — TETANUS-DIPHTH-ACELL PERTUSSIS 5-2.5-18.5 LF-MCG/0.5 IM SUSP: 0.5000 mL | Freq: Once | INTRAMUSCULAR | Status: DC

## 2017-06-19 MED ORDER — LACTATED RINGERS IV SOLN
INTRAVENOUS | Status: DC | PRN
  Administered 2017-06-19: 40 [IU] via INTRAVENOUS

## 2017-06-19 MED ORDER — MENTHOL 3 MG MT LOZG: 1.0000 | LOZENGE | OROMUCOSAL | Status: DC | PRN

## 2017-06-19 MED ORDER — SIMETHICONE 80 MG PO CHEW
80.0000 mg | CHEWABLE_TABLET | ORAL | Status: DC | PRN
  Administered 2017-06-22: 80 mg via ORAL

## 2017-06-19 MED ORDER — LACTATED RINGERS IV SOLN
INTRAVENOUS | Status: DC
  Administered 2017-06-19 (×2): via INTRAVENOUS

## 2017-06-19 MED ORDER — ONDANSETRON HCL 4 MG/2ML IJ SOLN
INTRAMUSCULAR | Status: DC | PRN
  Administered 2017-06-19: 4 mg via INTRAVENOUS

## 2017-06-19 MED ORDER — DIPHENHYDRAMINE HCL 25 MG PO CAPS: 25.0000 mg | ORAL_CAPSULE | Freq: Four times a day (QID) | ORAL | Status: DC | PRN

## 2017-06-19 MED ORDER — DIPHENHYDRAMINE HCL 50 MG/ML IJ SOLN: 12.5000 mg | INTRAMUSCULAR | Status: DC | PRN

## 2017-06-19 MED ORDER — ONDANSETRON HCL 4 MG/2ML IJ SOLN: 4.0000 mg | Freq: Three times a day (TID) | INTRAMUSCULAR | Status: DC | PRN

## 2017-06-19 MED ORDER — WITCH HAZEL-GLYCERIN EX PADS: 1.0000 "application " | MEDICATED_PAD | CUTANEOUS | Status: DC | PRN

## 2017-06-19 MED ORDER — NALBUPHINE HCL 10 MG/ML IJ SOLN: 5.0000 mg | Freq: Once | INTRAMUSCULAR | Status: DC | PRN

## 2017-06-19 MED ORDER — OXYTOCIN 40 UNITS IN LACTATED RINGERS INFUSION - SIMPLE MED: 2.5000 [IU]/h | INTRAVENOUS | Status: DC

## 2017-06-19 MED ORDER — NALBUPHINE HCL 10 MG/ML IJ SOLN: 5.0000 mg | INTRAMUSCULAR | Status: DC | PRN

## 2017-06-19 MED ORDER — SIMETHICONE 80 MG PO CHEW
80.0000 mg | CHEWABLE_TABLET | ORAL | Status: DC
  Administered 2017-06-19 – 2017-06-21 (×3): 80 mg via ORAL
  Filled 2017-06-19 (×3): qty 1

## 2017-06-19 MED ORDER — SENNOSIDES-DOCUSATE SODIUM 8.6-50 MG PO TABS
2.0000 | ORAL_TABLET | ORAL | Status: DC
  Administered 2017-06-19 – 2017-06-20 (×2): 2 via ORAL
  Filled 2017-06-19 (×3): qty 2

## 2017-06-19 MED ORDER — DEXTROSE IN LACTATED RINGERS 5 % IV SOLN: INTRAVENOUS | Status: DC

## 2017-06-19 MED ORDER — LACTATED RINGERS IV SOLN
INTRAVENOUS | Status: DC | PRN
  Administered 2017-06-19: 10:00:00 via INTRAVENOUS

## 2017-06-19 MED ORDER — BUPIVACAINE IN DEXTROSE 0.75-8.25 % IT SOLN
INTRATHECAL | Status: DC | PRN
  Administered 2017-06-19: 1.6 mL via INTRATHECAL

## 2017-06-19 MED ORDER — FENTANYL CITRATE (PF) 100 MCG/2ML IJ SOLN
INTRAMUSCULAR | Status: DC | PRN
  Administered 2017-06-19: 20 ug via INTRATHECAL

## 2017-06-19 MED ORDER — PRENATAL MULTIVITAMIN CH
1.0000 | ORAL_TABLET | Freq: Every day | ORAL | Status: DC
  Administered 2017-06-20 – 2017-06-21 (×2): 1 via ORAL
  Filled 2017-06-19 (×2): qty 1

## 2017-06-19 MED ORDER — SIMETHICONE 80 MG PO CHEW
80.0000 mg | CHEWABLE_TABLET | Freq: Three times a day (TID) | ORAL | Status: DC
  Administered 2017-06-19 – 2017-06-22 (×8): 80 mg via ORAL
  Filled 2017-06-19 (×10): qty 1

## 2017-06-19 MED ORDER — ZOLPIDEM TARTRATE 5 MG PO TABS: 5.0000 mg | ORAL_TABLET | Freq: Every evening | ORAL | Status: DC | PRN

## 2017-06-19 MED ORDER — OXYCODONE-ACETAMINOPHEN 5-325 MG PO TABS
1.0000 | ORAL_TABLET | ORAL | Status: DC | PRN
  Administered 2017-06-19 – 2017-06-22 (×5): 1 via ORAL
  Filled 2017-06-19 (×5): qty 1

## 2017-06-19 MED ORDER — KETOROLAC TROMETHAMINE 30 MG/ML IJ SOLN
30.0000 mg | Freq: Four times a day (QID) | INTRAMUSCULAR | Status: AC | PRN
  Administered 2017-06-19: 30 mg via INTRAVENOUS
  Filled 2017-06-19: qty 1

## 2017-06-19 MED ORDER — METHYLERGONOVINE MALEATE 0.2 MG/ML IJ SOLN: 0.2000 mg | INTRAMUSCULAR | Status: DC | PRN

## 2017-06-19 MED ORDER — MORPHINE SULFATE (PF) 0.5 MG/ML IJ SOLN
INTRAMUSCULAR | Status: DC | PRN
  Administered 2017-06-19: .2 mg via INTRATHECAL

## 2017-06-19 MED ORDER — PENTOSAN POLYSULFATE SODIUM 100 MG PO CAPS
100.0000 mg | ORAL_CAPSULE | Freq: Every day | ORAL | Status: DC
  Administered 2017-06-19 – 2017-06-21 (×3): 100 mg via ORAL
  Filled 2017-06-19 (×5): qty 1

## 2017-06-19 MED ORDER — SODIUM CHLORIDE 0.9% FLUSH: 3.0000 mL | INTRAVENOUS | Status: DC | PRN

## 2017-06-19 MED ORDER — DIPHENHYDRAMINE HCL 25 MG PO CAPS: 25.0000 mg | ORAL_CAPSULE | ORAL | Status: DC | PRN

## 2017-06-19 MED ORDER — KETOROLAC TROMETHAMINE 30 MG/ML IJ SOLN
30.0000 mg | Freq: Four times a day (QID) | INTRAMUSCULAR | Status: AC | PRN
Start: 2017-06-19 — End: 2017-06-20

## 2017-06-19 MED ORDER — BUPIVACAINE HCL (PF) 0.25 % IJ SOLN
INTRAMUSCULAR | Status: AC
  Filled 2017-06-19: qty 20

## 2017-06-19 MED ORDER — BUPIVACAINE HCL (PF) 0.25 % IJ SOLN
INTRAMUSCULAR | Status: DC | PRN
  Administered 2017-06-19: 20 mL

## 2017-06-19 MED ORDER — IBUPROFEN 600 MG PO TABS
600.0000 mg | ORAL_TABLET | Freq: Four times a day (QID) | ORAL | Status: DC
  Administered 2017-06-19 – 2017-06-22 (×13): 600 mg via ORAL
  Filled 2017-06-19 (×14): qty 1

## 2017-06-19 MED ORDER — LACTATED RINGERS IV SOLN
INTRAVENOUS | Status: DC
  Administered 2017-06-19: 20:00:00 via INTRAVENOUS

## 2017-06-19 MED ORDER — COCONUT OIL OIL: 1.0000 "application " | TOPICAL_OIL | Status: DC | PRN

## 2017-06-19 MED ORDER — OXYCODONE-ACETAMINOPHEN 5-325 MG PO TABS
2.0000 | ORAL_TABLET | ORAL | Status: DC | PRN
  Administered 2017-06-20 – 2017-06-22 (×9): 2 via ORAL
  Filled 2017-06-19 (×9): qty 2

## 2017-06-19 MED ORDER — ACETAMINOPHEN 325 MG PO TABS
650.0000 mg | ORAL_TABLET | ORAL | Status: DC | PRN
  Administered 2017-06-19: 650 mg via ORAL
  Filled 2017-06-19: qty 2

## 2017-06-19 MED ORDER — SCOPOLAMINE 1 MG/3DAYS TD PT72
1.0000 | MEDICATED_PATCH | Freq: Once | TRANSDERMAL | Status: DC
  Administered 2017-06-19: 1.5 mg via TRANSDERMAL
  Filled 2017-06-19: qty 1

## 2017-06-19 MED ORDER — CEFAZOLIN SODIUM-DEXTROSE 2-4 GM/100ML-% IV SOLN
2.0000 g | INTRAVENOUS | Status: AC
  Administered 2017-06-19: 2 g via INTRAVENOUS
  Filled 2017-06-19: qty 100

## 2017-06-19 MED ORDER — SOD CITRATE-CITRIC ACID 500-334 MG/5ML PO SOLN
30.0000 mL | Freq: Once | ORAL | Status: AC
  Administered 2017-06-19: 30 mL via ORAL
  Filled 2017-06-19: qty 15

## 2017-06-19 MED ORDER — DIBUCAINE 1 % RE OINT: 1.0000 "application " | TOPICAL_OINTMENT | RECTAL | Status: DC | PRN

## 2017-06-19 MED ORDER — MORPHINE SULFATE (PF) 0.5 MG/ML IJ SOLN
INTRAMUSCULAR | Status: AC
  Filled 2017-06-19: qty 10

## 2017-06-19 MED ORDER — PHENYLEPHRINE 8 MG IN D5W 100 ML (0.08MG/ML) PREMIX OPTIME
INJECTION | INTRAVENOUS | Status: DC | PRN
  Administered 2017-06-19: 60 ug/min via INTRAVENOUS

## 2017-06-19 MED ORDER — MEPERIDINE HCL 25 MG/ML IJ SOLN: 6.2500 mg | INTRAMUSCULAR | Status: DC | PRN

## 2017-06-19 MED ORDER — NALOXONE HCL 0.4 MG/ML IJ SOLN: 0.4000 mg | INTRAMUSCULAR | Status: DC | PRN

## 2017-06-19 MED ORDER — MEPERIDINE HCL 25 MG/ML IJ SOLN
INTRAMUSCULAR | Status: AC
  Filled 2017-06-19: qty 1

## 2017-06-19 MED ORDER — NALOXONE HCL 4 MG/10ML IJ SOLN
1.0000 ug/kg/h | INTRAVENOUS | Status: DC | PRN
  Filled 2017-06-19: qty 5

## 2017-06-19 SURGICAL SUPPLY — 27 items
CHLORAPREP W/TINT 26ML (MISCELLANEOUS) ×3 IMPLANT
CLAMP CORD UMBIL (MISCELLANEOUS) ×3 IMPLANT
CLOTH BEACON ORANGE TIMEOUT ST (SAFETY) ×3 IMPLANT
DERMABOND ADVANCED (GAUZE/BANDAGES/DRESSINGS) ×2
DERMABOND ADVANCED .7 DNX12 (GAUZE/BANDAGES/DRESSINGS) ×1 IMPLANT
DRSG OPSITE POSTOP 4X10 (GAUZE/BANDAGES/DRESSINGS) ×3 IMPLANT
ELECT REM PT RETURN 9FT ADLT (ELECTROSURGICAL) ×3
ELECTRODE REM PT RTRN 9FT ADLT (ELECTROSURGICAL) ×1 IMPLANT
GLOVE BIO SURGEON STRL SZ7.5 (GLOVE) ×3 IMPLANT
GLOVE BIOGEL PI IND STRL 7.0 (GLOVE) ×1 IMPLANT
GLOVE BIOGEL PI INDICATOR 7.0 (GLOVE) ×2
GOWN STRL REUS W/TWL LRG LVL3 (GOWN DISPOSABLE) ×6 IMPLANT
NEEDLE HYPO 22GX1.5 SAFETY (NEEDLE) ×3 IMPLANT
NEEDLE HYPO 25X5/8 SAFETYGLIDE (NEEDLE) ×3 IMPLANT
NS IRRIG 1000ML POUR BTL (IV SOLUTION) ×3 IMPLANT
PACK C SECTION WH (CUSTOM PROCEDURE TRAY) ×3 IMPLANT
PENCIL SMOKE EVAC W/HOLSTER (ELECTROSURGICAL) ×3 IMPLANT
SUT MNCRL 0 VIOLET CTX 36 (SUTURE) ×2 IMPLANT
SUT MNCRL AB 3-0 PS2 27 (SUTURE) ×3 IMPLANT
SUT MON AB 2-0 CT1 27 (SUTURE) ×3 IMPLANT
SUT MON AB-0 CT1 36 (SUTURE) ×6 IMPLANT
SUT MONOCRYL 0 CTX 36 (SUTURE) ×4
SUT PLAIN 2 0 (SUTURE) ×2
SUT PLAIN ABS 2-0 CT1 27XMFL (SUTURE) ×1 IMPLANT
SYR CONTROL 10ML LL (SYRINGE) ×3 IMPLANT
TOWEL OR 17X24 6PK STRL BLUE (TOWEL DISPOSABLE) ×3 IMPLANT
TRAY FOLEY W/BAG SLVR 14FR LF (SET/KITS/TRAYS/PACK) ×3 IMPLANT

## 2017-06-19 NOTE — Lactation Note (Signed)
This note was copied from a baby's chart. Lactation Consultation Note  Patient Name: Boy Edger HouseHayden Tomei ZOXWR'UToday's Date: 06/19/2017   Parents called for assist w/latch. Latch attempted, but not successful. Hand expression was done w/ Mom to calm infant. Infant sucked colostrum off gloved finger & then fell asleep skin-to-skin on Mom's chest.  Although teacup hold may do well to help latch "Knox," hand pump provided to see if everted nipple would evert further.   RN given update.   Lurline HareRichey, Philomena Buttermore [facility] 06/19/2017, 10:17 PM

## 2017-06-19 NOTE — Anesthesia Preprocedure Evaluation (Signed)
Anesthesia Evaluation  Patient identified by MRN, date of birth, ID band Patient awake    Reviewed: Allergy & Precautions, NPO status , Patient's Chart, lab work & pertinent test results  Airway Mallampati: I  TM Distance: >3 FB Neck ROM: Full    Dental  (+) Teeth Intact   Pulmonary former smoker,    Pulmonary exam normal breath sounds clear to auscultation       Cardiovascular negative cardio ROS Normal cardiovascular exam Rhythm:Regular Rate:Normal     Neuro/Psych PSYCHIATRIC DISORDERS Anxiety Depression negative neurological ROS     GI/Hepatic Neg liver ROS, GERD  ,  Endo/Other  Obesity  Renal/GU negative Renal ROS  negative genitourinary   Musculoskeletal  (+) Arthritis ,   Abdominal (+) + obese,   Peds  Hematology negative hematology ROS (+)   Anesthesia Other Findings   Reproductive/Obstetrics IUGR Breech presentation 37 weeks Elevated Maternal Serum AFP- unknown etiology                             Anesthesia Physical Anesthesia Plan  ASA: II  Anesthesia Plan: Spinal   Post-op Pain Management:    Induction:   PONV Risk Score and Plan: 4 or greater and Scopolamine patch - Pre-op, Ondansetron and Treatment may vary due to age or medical condition  Airway Management Planned: Natural Airway  Additional Equipment:   Intra-op Plan:   Post-operative Plan:   Informed Consent: I have reviewed the patients History and Physical, chart, labs and discussed the procedure including the risks, benefits and alternatives for the proposed anesthesia with the patient or authorized representative who has indicated his/her understanding and acceptance.   Dental advisory given  Plan Discussed with: CRNA, Anesthesiologist and Surgeon  Anesthesia Plan Comments:         Anesthesia Quick Evaluation

## 2017-06-19 NOTE — Anesthesia Postprocedure Evaluation (Signed)
Anesthesia Post Note  Patient: Breanna Murray  Procedure(s) Performed: CESAREAN SECTION (N/A Abdomen)     Patient location during evaluation: PACU Anesthesia Type: Spinal Level of consciousness: oriented and awake and alert Pain management: pain level controlled Vital Signs Assessment: post-procedure vital signs reviewed and stable Respiratory status: spontaneous breathing, respiratory function stable and nonlabored ventilation Cardiovascular status: blood pressure returned to baseline and stable Postop Assessment: no headache, no backache, no apparent nausea or vomiting, patient able to bend at knees and spinal receding Anesthetic complications: no    Last Vitals:  Vitals:   06/19/17 1115 06/19/17 1156  BP: (!) 101/57 104/63  Pulse: 66 86  Resp: 18   Temp: 37.1 C 36.5 C  SpO2: 99% 100%    Last Pain:  Vitals:   06/19/17 1156  TempSrc: Oral   Pain Goal:                 Kaitlynd Phillips A.

## 2017-06-19 NOTE — Lactation Note (Addendum)
This note was copied from a baby's chart. Lactation Consultation Note  Patient Name: Breanna Murray IONGE'XToday's Date: 06/19/2017    Per Dr. Jena GaussHaddix, Mom takes Elmiron & Trintellix (vortioxetine). According to [employer]'s "Medications & Mother's Milk," vortioxetine is an SMS (serotonin modulator & stimulator) & because of "the absence of specific data regarding this medication or its antidepressant class" it is recommended that alternative medications be considered during breastfeeding. [employer] concurs.  Vortioxetine is not noted on Mom's MAR. The above has not yet been shared with parents, but will request that this info be passed along as appropriate.  Breanna Murray, Breanna Murray [facility] 06/19/2017, 10:49 PM

## 2017-06-19 NOTE — Transfer of Care (Cosign Needed)
Immediate Anesthesia Transfer of Care Note  Patient: Breanna Murray  Procedure(s) Performed: CESAREAN SECTION (N/A Abdomen)  Patient Location: PACU2  Anesthesia Type:Spinal  Level of Consciousness: awake, alert  and oriented  Airway & Oxygen Therapy: Patient Spontanous Breathing  Post-op Assessment: Report given to RN and Post -op Vital signs reviewed and stable  Post vital signs: Reviewed and stable  Last Vitals:  Vitals Value Taken Time  BP    Temp    Pulse    Resp    SpO2      Last Pain:  Vitals:   06/19/17 0847  TempSrc: Oral         Complications: No apparent anesthesia complications

## 2017-06-19 NOTE — Op Note (Signed)
Cesarean Section Procedure Note  Indications: malpresentation: frank breech, oligohydramnios and complicated IUGR  Pre-operative Diagnosis: 37 week 0 day pregnancy.  Post-operative Diagnosis: same  Surgeon: Lenoard AdenAAVON,Antionne Enrique J   Assistants: Idelle JoSigmon, CNM  Anesthesia: Local anesthesia 0.25.% bupivacaine and Spinal anesthesia  ASA Class: 2  Procedure Details  The patient was seen in the Holding Room. The risks, benefits, complications, treatment options, and expected outcomes were discussed with the patient.  The patient concurred with the proposed plan, giving informed consent. The risks of anesthesia, infection, bleeding and possible injury to other organs discussed. Injury to bowel, bladder, or ureter with possible need for repair discussed. Possible need for transfusion with secondary risks of hepatitis or HIV acquisition discussed. Post operative complications to include but not limited to DVT, PE and Pneumonia noted. The site of surgery properly noted/marked. The patient was taken to Operating Room # 9, identified as Milon DikesHayden G Vanengen and the procedure verified as C-Section Delivery. A Time Out was held and the above information confirmed.  After induction of anesthesia, the patient was draped and prepped in the usual sterile manner. A Pfannenstiel incision was made and carried down through the subcutaneous tissue to the fascia. Fascial incision was made and extended transversely using Mayo scissors. The fascia was separated from the underlying rectus tissue superiorly and inferiorly. The peritoneum was identified and entered. Peritoneal incision was extended longitudinally. The utero-vesical peritoneal reflection was incised transversely and the bladder flap was bluntly freed from the lower uterine segment. A low transverse uterine incision(Kerr hysterotomy) was made. Delivered from frank breech presentation was a  female with Apgar scores of 8 at one minute and 9 at five minutes. Bulb suctioning  gently performed. Neonatal team in attendance.After the umbilical cord was clamped and cut cord blood was obtained for evaluation. The placenta was removed intact and appeared normal. The uterus was curetted with a dry lap pack. Good hemostasis was noted.The uterine outline, tubes and ovaries appeared normal. The uterine incision was closed with running locked sutures of 0 Monocryl x 2 layers. Hemostasis was observed. The parietal peritoneum was closed with a running 2-0 Monocryl suture. The fascia was then reapproximated with running sutures of 0 Monocryl. The skin was reapproximated with 3-0 monocryl after Oxford closure with 2-0 plain.  Instrument, sponge, and needle counts were correct prior the abdominal closure and at the conclusion of the case.   Findings: As noted  Estimated Blood Loss:  500         Drains: foley                 Specimens: placenta                 Complications:  None; patient tolerated the procedure well.         Disposition: PACU - hemodynamically stable.         Condition: stable  Attending Attestation: I did procedure

## 2017-06-19 NOTE — Lactation Note (Signed)
This note was copied from a baby's chart. Lactation Consultation Note  Patient Name: Breanna Murray ZOXWR'UToday's Date: 06/19/2017   Initial visit at 7 hours of life. Mom is a primip who reports considerable breast changes w/pregnancy. Mom was made aware of O/P services, breastfeeding support groups, community resources, and our phone # for post-discharge questions.   Parents made aware that 37-weekers can be "pokey" at times with breastfeeding. Mom already her personal pump in room. Parents are amenable to me returning to assess feeding. Mom has my # to call for assist w/next feeding.  Mom is on Elmiron 100mg  qd (L2) and Trintellix (L3).   Lurline HareRichey, Jannett Schmall [facility] 06/19/2017, 5:28 PM

## 2017-06-19 NOTE — Progress Notes (Signed)

## 2017-06-19 NOTE — Anesthesia Procedure Notes (Signed)
Spinal  Patient location during procedure: OR Start time: 06/19/2017 9:20 AM Staffing Anesthesiologist: Mal AmabileFoster, Lecia Esperanza, MD Performed: anesthesiologist  Preanesthetic Checklist Completed: patient identified, site marked, surgical consent, pre-op evaluation, timeout performed, IV checked, risks and benefits discussed and monitors and equipment checked Spinal Block Patient position: sitting Prep: site prepped and draped and DuraPrep Patient monitoring: heart rate, cardiac monitor, continuous pulse ox and blood pressure Approach: midline Location: L3-4 Injection technique: single-shot Needle Needle type: Pencan  Needle gauge: 24 G Needle length: 9 cm Needle insertion depth: 5 cm Assessment Sensory level: T4 Additional Notes Patient tolerated procedure well. Adequate sensory level.

## 2017-06-19 NOTE — Progress Notes (Signed)
Patient ID: Breanna Murray, female   DOB: Jul 03, 1990, 27 y.o.   MRN: [id number] Patient seen and examined. Consent witnessed and signed. No changes noted. Update completed.

## 2017-06-20 LAB — CBC
HCT: 32.3 % — ABNORMAL LOW (ref 36.0–46.0)
HEMOGLOBIN: 10.8 g/dL — AB (ref 12.0–15.0)
MCH: 28.6 pg (ref 26.0–34.0)
MCHC: 33.4 g/dL (ref 30.0–36.0)
MCV: 85.7 fL (ref 78.0–100.0)
Platelets: 229 10*3/uL (ref 150–400)
RBC: 3.77 MIL/uL — AB (ref 3.87–5.11)
RDW: 13.4 % (ref 11.5–15.5)
WBC: 11.6 10*3/uL — AB (ref 4.0–10.5)

## 2017-06-20 MED ORDER — MAGNESIUM OXIDE 400 (241.3 MG) MG PO TABS
400.0000 mg | ORAL_TABLET | Freq: Every day | ORAL | Status: DC
  Administered 2017-06-20 – 2017-06-21 (×2): 400 mg via ORAL
  Filled 2017-06-20 (×4): qty 1

## 2017-06-20 MED ORDER — POLYSACCHARIDE IRON COMPLEX 150 MG PO CAPS
150.0000 mg | ORAL_CAPSULE | Freq: Every day | ORAL | Status: DC
  Administered 2017-06-20 – 2017-06-21 (×2): 150 mg via ORAL
  Filled 2017-06-20 (×2): qty 1

## 2017-06-20 NOTE — Lactation Note (Signed)
This note was copied from a baby's chart. Lactation Consultation Note  Patient Name: Breanna Edger HouseHayden Trulson RUEAV'WToday's Date: 06/20/2017   Visited with P1 Mom, baby 9129 hrs old.  Baby was sleeping STS on FOB after circumcision.  Parents trying to get some rest.   Mom states that baby is feeding much better.   Asked Mom to call for next feeding for RN or LC to assess latch.   Breanna Murray, Breanna Murray E 06/20/2017, 2:54 PM

## 2017-06-20 NOTE — Lactation Note (Addendum)
This note was copied from a baby's chart. Lactation Consultation Note  Patient Name: Breanna Murray WUJWJ'XToday's Date: 06/20/2017 Reason for consult: Follow-up assessment   Baby 34 hours old.  3818w1d.Was circumcised today and has been sleepy. Mother states earlier he was latching well but since circ has been fussy. Provided education on day of circ fussiness is WNL.  Mother had recently spoon fed baby drops. Assisted w/ latching baby.  Baby having difficulty sustaining latch. Attempted in all positions with and without #20NS. Baby did sustain for 5 min and fell back asleep. Helped mother w/ DEBP and compressed breast.  Good colostrum flow.  Taught parents how to finger syringe feed.  Baby received 10 ml of colostrum. Encouraged STS.  Recommend limiting his feedings to 30 min and supplement w/ breastmilk. Mom encouraged to feed baby 8-12 times/24 hours and with feeding cues at least q 3 hours.       Maternal Data Has patient been taught Hand Expression?: Yes Does the patient have breastfeeding experience prior to this delivery?: No  Feeding Feeding Type: Breast Fed Length of feed: 5 min  LATCH Score Latch: Repeated attempts needed to sustain latch, nipple held in mouth throughout feeding, stimulation needed to elicit sucking reflex.  Audible Swallowing: A few with stimulation  Type of Nipple: Everted at rest and after stimulation  Comfort (Breast/Nipple): Soft / non-tender  Hold (Positioning): Assistance needed to correctly position infant at breast and maintain latch.  LATCH Score: 7  Interventions Interventions: Breast feeding basics reviewed;Assisted with latch;Skin to skin;Breast massage;Hand express;Breast compression;Position options;Support pillows;DEBP  Lactation Tools Discussed/Used     Consult Status Consult Status: Follow-up Date: 06/21/17 Follow-up type: In-patient    Dahlia ByesBerkelhammer, [facility] 06/20/2017, 8:50 PM

## 2017-06-20 NOTE — Progress Notes (Signed)
POSTOPERATIVE DAY # 1 S/P Primary LTCS for frank breech, IUGR, and oligohydramnios, baby boy "Lucky Cowboy"   S:         Reports feeling well overall, reports some left sided incisional pain, but overall pain is well controlled with Motrin and Percocet.  Pt. Reports interest in going home early tomorrow - has already discussed with peds and they are on board per patient.               Tolerating po intake / no nausea / no vomiting / no flatus / no BM/+ belching   Denies dizziness, SOB, or CP             Bleeding is light             Up ad lib / ambulatory/ voiding QS  Newborn breast feeding and pumping - going well / Circumcision - planning today   States she has been off Trintellix since pregnancy and has felt great off the medication - has been seen at [facility] in the past, but not in some time.  States she does not plan to resume Trintellix postpartum - feels like her hormones have been regulated.     O:  VS: BP 92/66 (BP Location: Left Arm)   Pulse 64   Temp 98 F (36.7 C) (Oral)   Resp 18   Ht 5\' 5"  (1.651 m)   Wt 83.3 kg (183 lb 11.2 oz)   SpO2 99%   BMI 30.57 kg/m    LABS:               Recent Labs    06/19/17 0845 06/20/17 0532  WBC 11.2* 11.6*  HGB 12.9 10.8*  PLT 246 229               Bloodtype: --/--/O POS, O POS Performed at [facility], [address], [place], [place] [address]  [phone]/22 0845)  Rubella:                                               I&O: Intake/Output      04/22 0701 - 04/23 0700 04/23 0701 - 04/24 0700   P.O. 1440    I.V. (mL/kg) 2604.2 (31.3)    Total Intake(mL/kg) 4044.2 (48.5)    Urine (mL/kg/hr) 3250 500 (1.4)   Blood 710    Total Output 3960 500   Net +84.2 -500                     Physical Exam:             Alert and Oriented X3  Lungs: Clear and unlabored  Heart: regular rate and rhythm / no murmurs  Abdomen: soft, non-tender, non-distended, active bowel sounds in all quadrants              Fundus: firm, non-tender, U-1        Dressing: honeycomb dressing with dermabond c/d/i              Incision:  approximated with sutures / no erythema / no ecchymosis / no drainage  Perineum: intact  Lochia: small, no clots   Extremities: no edema, no calf pain or tenderness,   A:        POD # 1 S/P Primary LTCS  Mild ABL Anemia   Hx. Of Anxiety and depression - off Trintellix since pregnancy with no plans to resume  Hx. Of Interstitial cystitis - on [place]miron   P:        Routine postoperative care              Niferex 150mg  daily  Magnesium oxide 400mg  daily  Discussed watching for s/s of postpartum depression/anxiety   See lactation today   May shower today   Encouraged ambulation, and warm fluids to promote bowel motility   Anticipate early discharge home tomorrow  Carlean JewsMeredith Sigmon, MSN, CNM [facility]

## 2017-06-21 NOTE — Lactation Note (Signed)
This note was copied from a baby's chart. Lactation Consultation Note  Patient Name: Breanna Murray ZOXWR'UToday's Date: 06/21/2017 Reason for consult: Follow-up assessment;Primapara;1st time breastfeeding;Early term 37-38.6wks;Infant weight loss(2nd LC visit today , 7% weight loss )  Baby is 3753 hours old  2nd LC visit today.  Per mom was able to get 2 naps today.  Baby last fed af 1214.  As LC entered room, baby STS with mom starting to wake up.  Dad checked diaper dry, and assisted mom to start latch , LC had to  Assist with obtaining depth. Baby fed for 9 mins , FISH lips, depth achieved, Multiple swallows, increased with breast compressions. Baby released at 9 mins,  And content, and sleepy. Baby is STS with mom.  LC reassured mom and dad they are doing very well with breast feeding and reminded  Parents baby is a Early term infant and their potential feeding behaviors.    Maternal Data Has patient been taught Hand Expression?: Yes  Feeding Feeding Type: Breast Fed(left breast / cross cradle ) Length of feed: 9 min(multiple swallows noted, increased with compressions )  LATCH Score Latch: Grasps breast easily, tongue down, lips flanged, rhythmical sucking.  Audible Swallowing: Spontaneous and intermittent  Type of Nipple: Everted at rest and after stimulation  Comfort (Breast/Nipple): Filling, red/small blisters or bruises, mild/mod discomfort  Hold (Positioning): Assistance needed to correctly position infant at breast and maintain latch.  LATCH Score: 8  Interventions Interventions: Breast feeding basics reviewed;Assisted with latch;Skin to skin;Breast massage;Hand express;Breast compression;Adjust position;Support pillows;Position options;Expressed milk  Lactation Tools Discussed/Used Tools: Shells Shell Type: Inverted   Consult Status Consult Status: Follow-up Date: 06/22/17 Follow-up type: In-patient    Matilde SprangMargaret Ann Hunner Garcon 06/21/2017, 2:53 PM

## 2017-06-21 NOTE — Progress Notes (Signed)
Subjective: POD# 2 Information for the patient's newborn:  Breanna Murray, Boy Kallee [[id number]][[id number]]  female    Circ completed Baby name: Breanna Murray  Reports feeling well Feeding: Breastfeeding Patient reports tolerating PO.  Breast symptom: struggling with latch, working with LC. Pain controlled with PO meds Denies HA/SOB/C/P/N/V/dizziness. Flatus present. She reports vaginal bleeding as normal, without clots.  She is ambulating, urinating without difficulty.     Objective:   VS:    Vitals:   06/20/17 0540 06/20/17 0942 06/20/17 1750 06/21/17 0648  BP: 121/80 92/66 111/75 109/64  Pulse: 63 64 64 68  Resp: 18 18 18 18   Temp: 98 F (36.7 C) 98 F (36.7 C) 98.7 F (37.1 C) 98 F (36.7 C)  TempSrc: Oral Oral Oral Oral  SpO2: 98% 99%    Weight:      Height:          Intake/Output Summary (Last 24 hours) at 06/21/2017 0941 Last data filed at 06/20/2017 1000 Gross per 24 hour  Intake -  Output 500 ml  Net -500 ml        Recent Labs    06/19/17 0845 06/20/17 0532  WBC 11.2* 11.6*  HGB 12.9 10.8*  HCT 37.7 32.3*  PLT 246 229     Blood type: --/--/O POS, O POS Performed at [facility], [address] [place], [place] [id number]  (04/22 0845)  Rubella:   immune    Physical Exam:  General: alert, cooperative and no distress Abdomen: soft, NT Incision: clean, dry and intact Uterine Fundus: firm, below umbilicus, non-tender Lochia: minimal Ext: no edema, redness or tenderness in the calves or thighs      Assessment/Plan: 27 y.o.   POD#2. G1P1                  Principal Problem:   Postpartum care following cesarean delivery (4/22 Active Problems:   Breech birth   Cesarean delivery delivered: breech, oligo, IUGR   Doing well, stable.     Breastfeeding support, LC continued consult Routine post-op care DC home tomorrow  [name], CNM, MSN 06/21/2017, 9:41 AM

## 2017-06-21 NOTE — Lactation Note (Signed)
This note was copied from a baby's chart. Lactation Consultation Note  Patient Name: Breanna Murray ZOXWR'UToday's Date: 06/21/2017   Mom's milk is coming to volume. She says that she hears lots of swallows when [name] is at breast.   Mom has not taken Trintellix (vortioxetine) since pregnancy. In case Mom needs to resume any medication in the postpartum period, the section on vortioxetine from [facility]'s "Medications & Mother's Milk" (2019) was provided (which showed that serotonin modulators are not recommended during lactation b/c of lack of data).     Lurline HareRichey, Jari Carollo [facility] 06/21/2017, 9:03 PM

## 2017-06-21 NOTE — Lactation Note (Signed)
This note was copied from a baby's chart. Lactation Consultation Note  Patient Name: Breanna Murray ZOXWR'UToday's Date: 06/21/2017 Reason for consult: Follow-up assessment;Primapara;1st time breastfeeding;Infant weight loss;Early term 37-38.6wks a Mom and dad decided to stay another dad after [facility].  As LC entered the room, per mom baby had not fed since 3:40 am, and they had tried several times to latch  At 6 am/ 8 am.  Baby fully clothes and wrapped in a blanket.  LC recommended to check the baby's diaper/ noted to be dry/ and place the baby STS/ baby more awake/  And rooting. LC assisted mom to latch in the cross cradle on the left breast / 1st reviewed basics - breast massage,  Hand express, several drops of colostrum noted, and breast are filling full,  Baby latched with assistance to obtain depth. Would feed for 5 mins and release, and LC would assist to latch with depth and showed dad how he can assist mom to due the same.  Baby fed for 20 mins ( on/ and off 5 mins at a time ) and multiple swallows noted when baby latched and stayed in a consistent pattern. FISH lips noted. LC recommended due to areola edema / and breast feeling fuller, prior to latch - breast massage, hand express, pre-pump and then reverse pressure. Latch with assistance until baby learns the depth.  Shells between feedings except when sleeping.  LC recommended calling for assistance.    Maternal Data Has patient been taught Hand Expression?: Yes(LC - REVIEWED AND NOTED STEADY FLOW )  Feeding Feeding Type: Breast Fed Length of feed: 20 min(on and off . multiple swallows when latched )  LATCH Score Latch: Grasps breast easily, tongue down, lips flanged, rhythmical sucking.  Audible Swallowing: Spontaneous and intermittent  Type of Nipple: Everted at rest and after stimulation  Comfort (Breast/Nipple): Filling, red/small blisters or bruises, mild/mod discomfort(areola edema indicating  shells between feedings )  Hold (Positioning): Assistance needed to correctly position infant at breast and maintain latch.  LATCH Score: 8  Interventions Interventions: Breast feeding basics reviewed;Assisted with latch;Skin to skin;Breast massage;Hand express;Reverse pressure;Breast compression;Adjust position;Support pillows;Position options;Expressed milk;Shells;Hand pump  Lactation Tools Discussed/Used Tools: Shells;Pump;Nipple Shields Nipple shield size: Other (comment)(did not need NS to latch ) Shell Type: Inverted Breast pump type: Manual;Double-Electric Breast Pump   Consult Status Consult Status: Follow-up Date: 06/22/17 Follow-up type: In-patient    Matilde SprangMargaret Ann Nyles Mitton 06/21/2017, 10:16 AM

## 2017-06-22 MED ORDER — OXYCODONE-ACETAMINOPHEN 5-325 MG PO TABS: 1.0000 | ORAL_TABLET | Freq: Four times a day (QID) | ORAL | 0 refills | Status: DC | PRN

## 2017-06-22 MED ORDER — SUMATRIPTAN SUCCINATE 25 MG PO TABS
25.0000 mg | ORAL_TABLET | ORAL | Status: DC | PRN
  Administered 2017-06-22 (×2): 25 mg via ORAL
  Filled 2017-06-22 (×3): qty 1

## 2017-06-22 MED ORDER — ONDANSETRON HCL 4 MG PO TABS
4.0000 mg | ORAL_TABLET | Freq: Once | ORAL | Status: AC
  Administered 2017-06-22: 4 mg via ORAL
  Filled 2017-06-22: qty 1

## 2017-06-22 MED ORDER — IBUPROFEN 600 MG PO TABS: 600.0000 mg | ORAL_TABLET | Freq: Four times a day (QID) | ORAL | 0 refills | Status: DC

## 2017-06-22 NOTE — Progress Notes (Signed)
Dr Amado NashAlmquist was notified of the patient's request for discharge this afternoon. Dr Amado NashAlmquist will come to see the patient this afternoon.

## 2017-06-22 NOTE — MAU Provider Note (Signed)
PPD3 SVD:   S:  Pt reports h/a that has gotten worse. Denies visual changes. (+) nausea. Denies migraine. Notes sinus h/a only / Tolerating po/ Voiding without problems/ No n/v/ Bleeding is moderate/ Pain controlled withnarcotic analgesics including Percocet  Newborn info female, circ done   O:  A & O x 3 well/ VS: Blood pressure 132/76, pulse (!) 104, temperature 98 F (36.7 C), temperature source Oral, resp. rate 18, height 5\' 5"  (1.651 m), weight 83.3 kg (183 lb 11.2 oz), SpO2 100 %.  LABS: No results found for this or any previous visit (from the past 24 hour(s)).  I&O: No intake/output data recorded.   No intake/output data recorded.  Lungs: chest clear, no wheezing, rales, normal symmetric air entry, Heart exam - S1, S2 normal, no murmur, no gallop, rate regular  Heart: regular rate and rhythm, S1, S2 normal, no murmur, click, rub or gallop  Abdomen: soft non distended. Primary dressing d/c/i, uterus firm 1Fb below umb  Perineum: is normal  Lochia: mod  Extremities:edema 1+    A/P: PPD # 3/ G1P1   Headache with associated nausea. No hx migraine. Some improvement with bed flat P) anesthesia eval to r/o spinal h/a. zofran for nausea. Motrin as well.. D/c pending h/a mgmt Reassess in pm by Almquist for d/c home Cont postpartum care

## 2017-06-22 NOTE — Progress Notes (Signed)
Dr Sampson GoonFitzgerald has been notified to evaluate pt for a spinal headache per Dr Cherly Hensenousins order.

## 2017-06-22 NOTE — Progress Notes (Signed)
Pt feeling much better and requesting d/c home H/o migraine h/a and this was like prior migraines; last migraine was about 3 yrs ago  BP 132/76 (BP Location: Left Arm)   Pulse (!) 104   Temp 98 F (36.7 C) (Oral)   Resp 18   Ht 5\' 5"  (1.651 m)   Wt 183 lb 11.2 oz (83.3 kg)   SpO2 100%   BMI 30.57 kg/m   A&ox3 nml respirations Abd: soft, nt, nd; dressing: c/d/i LE: no edema, nt bilat  CBC Latest Ref Rng & Units 06/20/2017 06/19/2017 01/25/2017  WBC 4.0 - 10.5 K/uL 11.6(H) 11.2(H) 6.8  Hemoglobin 12.0 - 15.0 g/dL 10.8(L) 12.9 13.4  Hematocrit 36.0 - 46.0 % 32.3(L) 37.7 40.2  Platelets 150 - 400 K/uL 229 246 259     A/p: pod 3 s/p primary ltcs for breech 1. Doing well, d/c home and f/u in 6 wks; office to call in 1-2 wks for f/u 2. Migraine h/a - nearly resolved; reviewed her current pain management of ibuprofen and percocet but can take tylenol/ibuprofen in general for h/a; inform if continues to have migraines 3. Mild acute anemia, iron q day 4. H/o depression - precautions given for symptoms of depression, no c/o today 5. Mild anemia from acute blood loss, asymptomatic 6. Rubella immune, rh pos

## 2017-06-22 NOTE — Lactation Note (Signed)
This note was copied from a baby's chart. Lactation Consultation Note;  Infant is 3573 hours old and is 37.1 weeks and has a weight loss of 8%. Mother has been cue base feeding infant and has finger fed infant with a curved tip syringe.  Mother has headache with ice pack to head  and lying on her back attempting to breastfeed when I arrived in the room. Assist mother with side lying position with pillow support to her back.  Father taught to do a tea-cup hold to assist mother with latching infant on in side lying position.  Infant took a few sucks but was not showing hunger cues. Infant placed bedside of mother.  Mother was given a #5 fr feeding tube and syringe to finger feed infant with next feeding.  Parents to call staff or LC for assistance as needed.  Discussed treatment and prevention of engorgement.  Mothers breast are firm and full. She post pumped approx 70 ml. Advised mother to soften breast tissue with reverse pressure and hand express before latching infant.  Mother receptive to all teaching.  Mother is aware of available LC services , B[facility] and outpatient dept.   Patient Name: Boy Edger HouseHayden Leyland ZOXWR'UToday's Date: 06/22/2017 Reason for consult: Follow-up assessment   Maternal Data    Feeding Feeding Type: Breast Fed Length of feed: 24 min  LATCH Score                   Interventions    Lactation Tools Discussed/Used     Consult Status      Michel BickersKendrick, Durelle Zepeda McCoy 06/22/2017, 12:32 PM

## 2017-06-22 NOTE — Progress Notes (Signed)
Pt evaluated at the request of Dr. Cherly Hensenousins for potential PDPH. Pt states HA began 2 days after c-section for which she had spinal anesthesia with 24G pencan needle with no immediate complications. It is moderate to severe currently but has also waxed and waned and she has had periods of relief with PO medications. Pt states some neck stiffness but full ROM, no bulbar symptoms and some nausea. Pt laid down and had transient relief of symptoms but does not associate strong correlation. She has no focal neurologic symptoms at time of interview as well. She also states that she has a history of migraine headaches for which she has received injections a few years ago. States that this headache feels similar to her migraine headaches in the past. We discussed options for treatment including conservative tx (fluids, caffeine, analgesics, and imitrex for possible migraine) as well as aggressive tx with an epidural blood patch. We discussed the risks (bleeding, infection, nerve damage, continued HA, backache, arachnoiditis, and failure of relief of symptoms) and benefits (90% resolution of PDPH) to epidural blood patch. Given that her symptoms exhibit some classic PDPH elements but some are not and she has a history of migraines the patient elected to continue with conservative therapy at this time. She was informed that if the headaches persist or become even more severe or debilitating in nature that she can call us back and we will set up a time for epidural blood patch procedure, as well as if she gets discharged and would like to set up another consultation for evaluation of PDPH and epidural blood patch that she can come to [facility] with [facility]hesiologist on call. In the meantime pt has been instructed to stay hydrated, drink caffeine, and I have added imitrex to her meds while in hospital for migraine treatment.  Glade Stanford. Heily Carlucci, Md

## 2017-06-22 NOTE — Progress Notes (Signed)
CNM Sigmon is notified of pt's increased headache, review meds and BP 120/ 80.  Zofran order is received.

## 2017-06-22 NOTE — Discharge Summary (Signed)
Obstetric Discharge Summary Reason for Admission: cesarean section Prenatal Procedures: none Intrapartum Procedures: ltcs Postpartum Procedures: none Complications-Operative and Postpartum: migraine h/a Hemoglobin  Date Value Ref Range Status  06/20/2017 10.8 (L) 12.0 - 15.0 g/dL Final   HCT  Date Value Ref Range Status  06/20/2017 32.3 (L) 36.0 - 46.0 % Final    Physical Exam:  General: alert, cooperative and no distress Lochia: appropriate Uterine Fundus: firm Incision: healing well DVT Evaluation: No evidence of DVT seen on physical exam.  Discharge Diagnoses: Term Pregnancy-delivered  Discharge Information: Date: 06/22/2017 Activity: unrestricted and pelvic rest Diet: routine Medications: Ibuprofen, Iron and Percocet Condition: stable Instructions: refer to practice specific booklet Discharge to: home   Newborn Data: Live born female  Birth Weight: 6 lb 8.2 oz (2955 g) APGAR: 8, 9  Newborn Delivery   Birth date/time:  06/19/2017 09:44:00 Delivery type:  C-Section, Low Transverse Trial of labor:  No C-section categorization:  Primary     Home with mother.  Breanna Murray 06/22/2017, 5:33 PM

## 2017-06-22 NOTE — Addendum Note (Signed)
Addendum  created 06/22/17 1213 by Marcene DuosFitzgerald, Maximiliano Cromartie, MD   Order list changed, Sign clinical note

## 2017-07-22 ENCOUNTER — Encounter (HOSPITAL_COMMUNITY): Payer: Self-pay | Admitting: Obstetrics and Gynecology

## 2017-10-01 ENCOUNTER — Encounter (HOSPITAL_COMMUNITY): Payer: Self-pay

## 2017-12-05 ENCOUNTER — Ambulatory Visit (INDEPENDENT_AMBULATORY_CARE_PROVIDER_SITE_OTHER): Payer: PRIVATE HEALTH INSURANCE | Admitting: Physician Assistant

## 2017-12-05 ENCOUNTER — Encounter: Payer: Self-pay | Admitting: Physician Assistant

## 2017-12-05 DIAGNOSIS — F422 Mixed obsessional thoughts and acts: Secondary | ICD-10-CM

## 2017-12-05 DIAGNOSIS — F4010 Social phobia, unspecified: Secondary | ICD-10-CM | POA: Diagnosis not present

## 2017-12-05 MED ORDER — SERTRALINE HCL 100 MG PO TABS: ORAL_TABLET | ORAL | 1 refills | Status: DC

## 2017-12-05 MED ORDER — ALPRAZOLAM ER 0.5 MG PO TB24: 0.5000 mg | ORAL_TABLET | Freq: Two times a day (BID) | ORAL | 1 refills | Status: DC | PRN

## 2017-12-05 NOTE — Progress Notes (Signed)
[facility]  Patient ID: Breanna Murray,  MRN: [id number]  PCP: Etta Quill, PA-C  Date of Evaluation: 12/05/2017 Time spent:20 minutes   HISTORY/CURRENT STATUS: HPI patient presents today for evaluation of anxiety.  She was last seen in December 2018.  At that time she was approximately [redacted] weeks gestation and was on Trintellix.  Her OB did not want her on the Trintellix in the third trimester so she stopped that.  She is currently taking no medications at all.  She now has a healthy 59-month-old son named Lucky Cowboy.  She is very happy to be in a mother.  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  He was born 3 weeks premature and was in [facility] hospital 4 days for hypothermia.  That was a scary stressful time he is completely healthy now. She denies any depressive symptoms.  Her energy level and motivation are good.  She is not isolating her crying easily.  She enjoys things with her son and family. Shortly after her baby was born she had some mild depressive symptoms.  The worst thing that she is dealing with for the past couple of months is severe anxiety.  There have been several times that she has had to stop the car when driving because she became so anxious she felt dizzy like she might pass out.  She had to pull over until she started feeling better.  She abscesses about things, worries that something may happen to her son, she hates being in crowds because she becomes so much more anxious.  She is even been having itching that started in her scalp but now can happen all over her body, which she now believes is due to her anxiety.  She has seen several different dermatologist over the past few months and has been found to have no dermatologic problems.  When she has panic attacks they are associated with palpitations, shortness of breath, and generalized sweating. She has a hard time going to sleep because of racing thoughts.  Her son is  sleeping well through the night but now she has trouble sleeping because of all the worry.   Not breast feeding  Individual Medical History/ Review of Systems: Changes? :Yes 5 month post partum  Allergies: Bee venom; Metaxalone; Compazine [prochlorperazine edisylate]; and Dexamethasone  Current Medications:  Current Outpatient Medications:  .  ALPRAZolam (XANAX XR) 0.5 MG 24 hr tablet, Take 1 tablet (0.5 mg total) by mouth 2 (two) times daily as needed for anxiety., Disp: 30 tablet, Rfl: 1 .  sertraline (ZOLOFT) 100 MG tablet, 1/2 po q am x 2 wks, then 1  Po qam., Disp: 30 tablet, Rfl: 1 Medication Side Effects: None  Family Medical/ Social History: Changes? Yes she has a 17-month-old son  MENTAL HEALTH EXAM:  not currently breastfeeding.There is no height or weight on file to calculate BMI.  General Appearance: Well Groomed  Eye Contact:  Good  Speech:  Negative  Volume:  Normal  Mood:  Anxious  Affect:  Appropriate  Thought Process:  Coherent  Orientation:  Full (Time, Place, and Person)  Thought Content: Logical   Suicidal Thoughts:  No  Homicidal Thoughts:  No  Memory:  Immediate  Judgement:  Good  Insight:  Good  Psychomotor Activity:  Normal  Concentration:  Concentration: Good  Recall:  Good  Fund of Knowledge: Good  Language: Good  Akathisia:  No  AIMS (if indicated): not done  Assets:  Desire for Improvement  ADL's:  Intact  Cognition: WNL  Prognosis:  Good    DIAGNOSES:    ICD-10-CM   1. 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  Pros and cons, risk and benefits, side effects discussed and patient accepts.  Restart Xanax for as needed panic attacks. Recommend she get back into counseling.  I have her referred her to 1 of the therapist here in our office. Therapeutic lifestyle changes were discussed. Return to office in 4 to 6 weeks.    Melony Overly, PA-C

## 2017-12-07 ENCOUNTER — Other Ambulatory Visit: Payer: Self-pay | Admitting: Physician Assistant

## 2017-12-07 MED ORDER — ALPRAZOLAM 0.5 MG PO TABS: 0.5000 mg | ORAL_TABLET | Freq: Two times a day (BID) | ORAL | 0 refills | Status: DC | PRN

## 2017-12-08 ENCOUNTER — Other Ambulatory Visit: Payer: Self-pay | Admitting: Physician Assistant

## 2018-01-07 ENCOUNTER — Encounter: Payer: Self-pay | Admitting: Emergency Medicine

## 2018-01-07 DIAGNOSIS — F411 Generalized anxiety disorder: Secondary | ICD-10-CM | POA: Insufficient documentation

## 2018-01-07 DIAGNOSIS — F329 Major depressive disorder, single episode, unspecified: Secondary | ICD-10-CM | POA: Insufficient documentation

## 2018-01-08 ENCOUNTER — Ambulatory Visit: Payer: Self-pay | Admitting: Physician Assistant

## 2018-01-08 ENCOUNTER — Encounter

## 2018-01-10 ENCOUNTER — Ambulatory Visit: Payer: PRIVATE HEALTH INSURANCE | Admitting: Physician Assistant

## 2018-01-11 ENCOUNTER — Ambulatory Visit: Payer: PRIVATE HEALTH INSURANCE | Admitting: Mental Health

## 2018-01-22 ENCOUNTER — Other Ambulatory Visit: Payer: Self-pay

## 2018-01-22 MED ORDER — ALPRAZOLAM 0.5 MG PO TABS: 0.5000 mg | ORAL_TABLET | Freq: Two times a day (BID) | ORAL | 1 refills | Status: DC | PRN

## 2018-02-01 ENCOUNTER — Other Ambulatory Visit: Payer: Self-pay

## 2018-02-01 MED ORDER — ALPRAZOLAM 0.25 MG PO TABS: 0.5000 mg | ORAL_TABLET | Freq: Two times a day (BID) | ORAL | 0 refills | Status: DC | PRN

## 2018-02-07 ENCOUNTER — Other Ambulatory Visit: Payer: Self-pay

## 2018-02-07 MED ORDER — SERTRALINE HCL 100 MG PO TABS: ORAL_TABLET | ORAL | 0 refills | Status: DC

## 2018-03-05 ENCOUNTER — Ambulatory Visit (INDEPENDENT_AMBULATORY_CARE_PROVIDER_SITE_OTHER): Payer: 59 | Admitting: Physician Assistant

## 2018-03-05 ENCOUNTER — Encounter: Payer: Self-pay | Admitting: Physician Assistant

## 2018-03-05 DIAGNOSIS — F411 Generalized anxiety disorder: Secondary | ICD-10-CM | POA: Diagnosis not present

## 2018-03-05 DIAGNOSIS — F331 Major depressive disorder, recurrent, moderate: Secondary | ICD-10-CM

## 2018-03-05 MED ORDER — SERTRALINE HCL 100 MG PO TABS: ORAL_TABLET | ORAL | Status: DC

## 2018-03-05 MED ORDER — ALPRAZOLAM 0.5 MG PO TABS: 0.5000 mg | ORAL_TABLET | Freq: Two times a day (BID) | ORAL | 1 refills | Status: DC | PRN

## 2018-03-05 NOTE — Progress Notes (Signed)
[facility]  Patient ID: Breanna Murray,  MRN: [id number]  PCP: Etta Quill, PA-C  Date of Evaluation: 03/05/2018 Time spent:15 minutes  Chief Complaint:  Chief Complaint    Depression      HISTORY/CURRENT STATUS: HPI Since LOV, found out she was pregnant and miscarried a few weeks later.  This is been a very difficult experience for her because she dealt with infertility for so long and now has her 77-month-old son.  She and her husband were very excited about the pregnancy and then when she lost the baby it was devastating.  She was having trouble on the Zoloft initially.  When she started taking it at 50 mg she had dizziness and nausea.  We decreased it down to 25 mg for 2 weeks or so and then increased it back up.  It was during this time that she had the miscarriage.  She is now at 50 mg and thinks she would be able to tolerate higher dose and feels that she really needs to.  She does have a lot of sadness and knows it may be due to her circumstances but also feels that the medication would be beneficial at a higher dose as we have discussed in the past.  She is sleeping pretty well.  The Xanax is helping with the anxiety.  Some days she does not take it at all and other days she needs it twice a day.  Individual Medical History/ Review of Systems: Changes? :Yes See above  Allergies: Bee venom; Metaxalone; Compazine [prochlorperazine edisylate]; and Dexamethasone  Current Medications:  Current Outpatient Medications:  .  ALPRAZolam (XANAX) 0.5 MG tablet, Take 1 tablet (0.5 mg total) by mouth 2 (two) times daily as needed for anxiety., Disp: 60 tablet, Rfl: 1 .  sertraline (ZOLOFT) 100 MG tablet, 3/4 qd for 1 week, then 1 daily (100mg ), Disp: , Rfl:  Medication Side Effects: anxiety  Family Medical/ Social History: Changes? No  MENTAL HEALTH EXAM:  There were no vitals taken for this visit.There is no height or weight on file to calculate BMI.  General  Appearance: Casual and Well Groomed  Eye Contact:  Good  Speech:  Clear and Coherent  Volume:  Normal  Mood:  Euthymic  Affect:  Appropriate  Thought Process:  Goal Directed  Orientation:  Full (Time, Place, and Person)  Thought Content: Logical   Suicidal Thoughts:  No  Homicidal Thoughts:  No  Memory:  WNL  Judgement:  Good  Insight:  Good  Psychomotor Activity:  Normal  Concentration:  Concentration: Good  Recall:  Good  Fund of Knowledge: Good  Language: Good  Assets:  Desire for Improvement  ADL's:  Intact  Cognition: WNL  Prognosis:  Good    DIAGNOSES:    ICD-10-CM   1. Major depressive disorder, recurrent episode, moderate (HCC) F33.1   2. Generalized anxiety disorder F41.1   Recent miscarriage  Receiving Psychotherapy: Yes    RECOMMENDATIONS: Increase Zoloft to 75 mg.  She has enough of the 100 mg that she will cut in half and then half a tab.  Even if she does not get the exact dose, that is fine.  She will do that for about 1 week and then can go to 100 mg.  She verbalizes understanding. Continue Xanax as needed. Return in 6 weeks.   Melony Overly, PA-C

## 2018-04-16 ENCOUNTER — Encounter: Payer: Self-pay | Admitting: Physician Assistant

## 2018-04-16 ENCOUNTER — Ambulatory Visit (INDEPENDENT_AMBULATORY_CARE_PROVIDER_SITE_OTHER): Payer: 59 | Admitting: Physician Assistant

## 2018-04-16 ENCOUNTER — Ambulatory Visit: Payer: 59 | Admitting: Physician Assistant

## 2018-04-16 DIAGNOSIS — F331 Major depressive disorder, recurrent, moderate: Secondary | ICD-10-CM | POA: Diagnosis not present

## 2018-04-16 DIAGNOSIS — F411 Generalized anxiety disorder: Secondary | ICD-10-CM

## 2018-04-16 MED ORDER — SERTRALINE HCL 100 MG PO TABS: 100.0000 mg | ORAL_TABLET | Freq: Every day | ORAL | 1 refills | Status: DC

## 2018-04-16 NOTE — Progress Notes (Signed)
[facility]  Patient ID: Breanna Murray,  MRN: [id number]  PCP: Etta Quill, PA-C  Date of Evaluation: 04/16/2018 Time spent:15 minutes  Chief Complaint:  Chief Complaint    Follow-up      HISTORY/CURRENT STATUS: HPI here for routine med check.  Accompanied by her 64-month-old son, Breanna Murray.  At the last visit approximately 6 weeks ago, we increased the Zoloft.  She is now on 100 mg and doing very well.  She has had no side effects from the medication.  Patient denies loss of interest in usual activities and is able to enjoy things.  Denies decreased energy or motivation.  Appetite has not changed.  No extreme sadness, tearfulness, or feelings of hopelessness.  Denies any changes in concentration, making decisions or remembering things.  Denies suicidal or homicidal thoughts.  She still gets anxious in new situations.  She has trouble traveling, meeting new people and going to different places.  She sleeps well overall.  Her baby sleeps around 7 to 8 hours per night.  Denies muscle or joint pain, stiffness, or dystonia.  Denies dizziness, syncope, seizures, numbness, tingling, tremor, tics, unsteady gait, slurred speech, confusion.   Individual Medical History/ Review of Systems: Changes? :No   Allergies: Bee venom; Metaxalone; Compazine [prochlorperazine edisylate]; and Dexamethasone  Current Medications:  Current Outpatient Medications:  .  ALPRAZolam (XANAX) 0.5 MG tablet, Take 1 tablet (0.5 mg total) by mouth 2 (two) times daily as needed for anxiety., Disp: 60 tablet, Rfl: 1 .  sertraline (ZOLOFT) 100 MG tablet, Take 1 tablet (100 mg total) by mouth daily., Disp: 90 tablet, Rfl: 1 Medication Side Effects: none  Family Medical/ Social History: Changes? No  MENTAL HEALTH EXAM:  There were no vitals taken for this visit.There is no height or weight on file to calculate BMI.  General Appearance: Casual and Well Groomed  Eye Contact:  Good  Speech:  Clear  and Coherent  Volume:  Normal  Mood:  Euthymic  Affect:  Appropriate  Thought Process:  Goal Directed  Orientation:  Full (Time, Place, and Person)  Thought Content: Logical   Suicidal Thoughts:  No  Homicidal Thoughts:  No  Memory:  WNL  Judgement:  Good  Insight:  Good  Psychomotor Activity:  Normal  Concentration:  Concentration: Good  Recall:  Good  Fund of Knowledge: Good  Language: Good  Assets:  Desire for Improvement  ADL's:  Intact  Cognition: WNL  Prognosis:  Good    DIAGNOSES:    ICD-10-CM   1. Major depressive disorder, recurrent episode, moderate (HCC) F33.1   2. Generalized anxiety disorder F41.1     Receiving Psychotherapy: No    RECOMMENDATIONS: Continue Zoloft 100 mg daily. Continue Xanax 0.5 mg twice daily as needed.   Return in 6 months or sooner as needed.   Melony Overly, PA-C

## 2018-10-03 IMAGING — US US MFM OB DETAIL+14 WK
1 series · 13 of 28 positions shown · non-contrast
Comparison: none

[Series 1: us mfm ob detail+14 wk · 100 acquisitions, 13 frames shown]
[im 4/100]
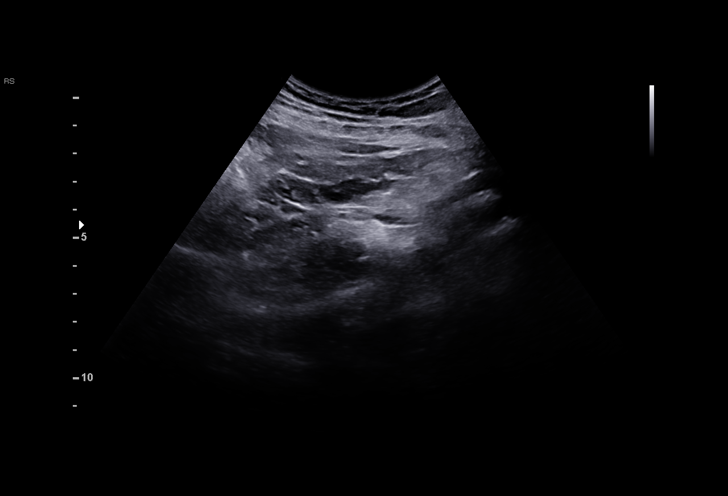
[im 12/100]
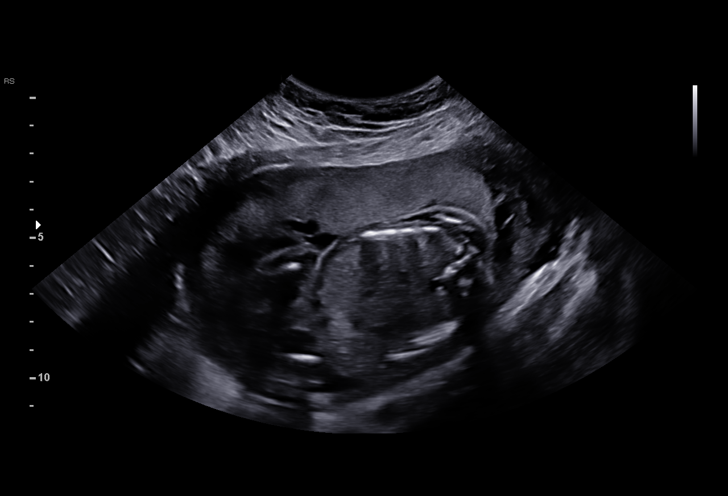
[im 19/100]
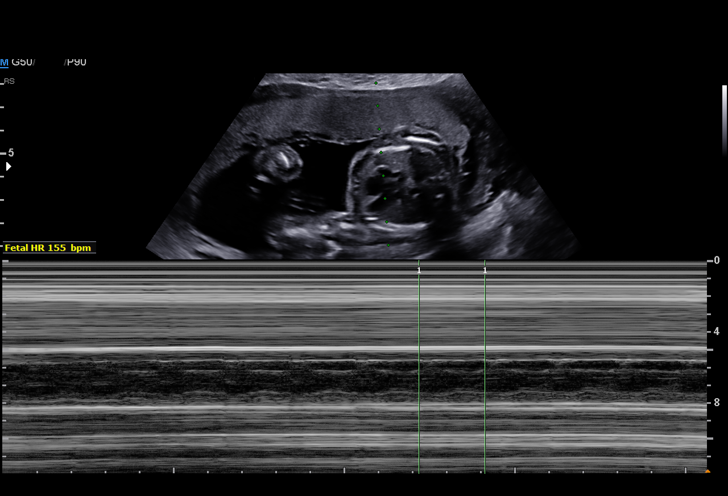
[im 26/100]
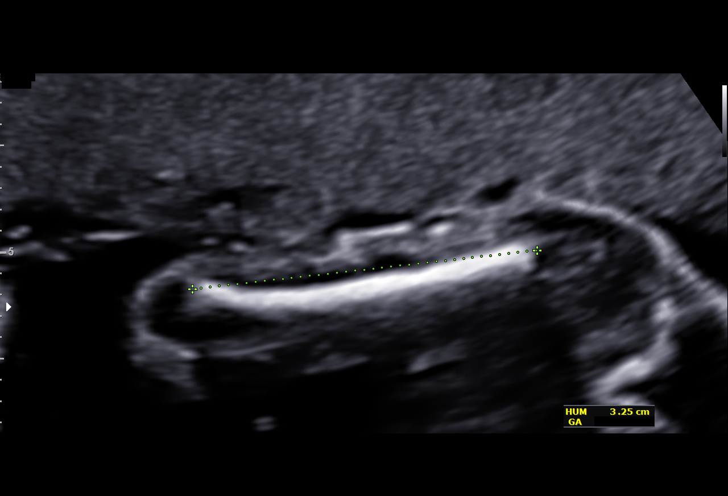
[im 34/100]
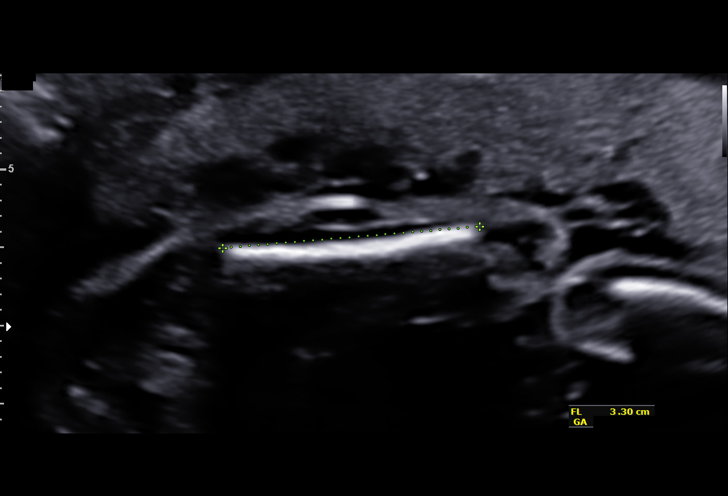
[im 41/100]
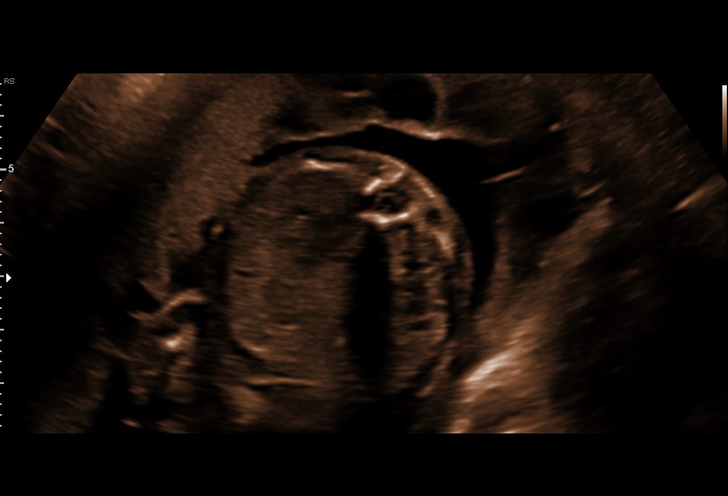
[im 52/100]
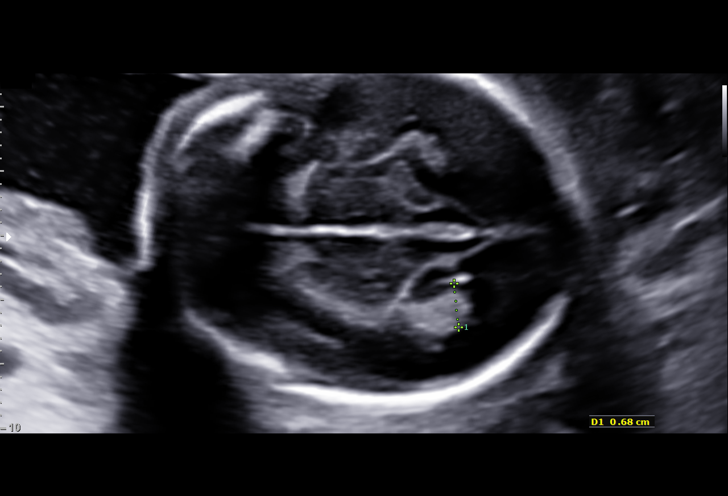
[im 59/100]
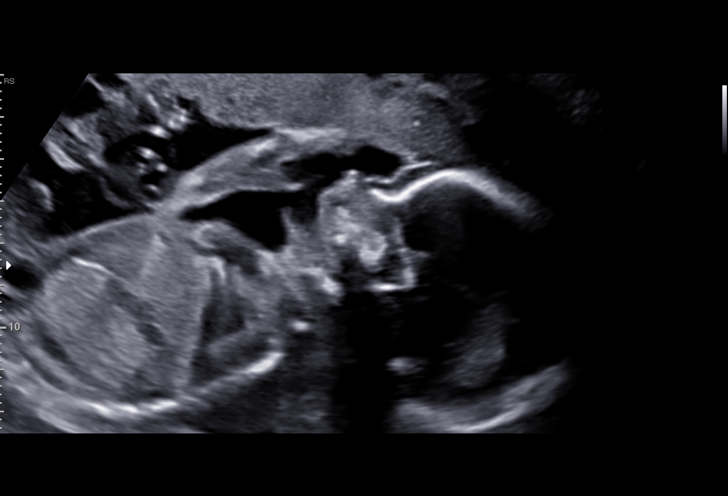
[im 67/100]
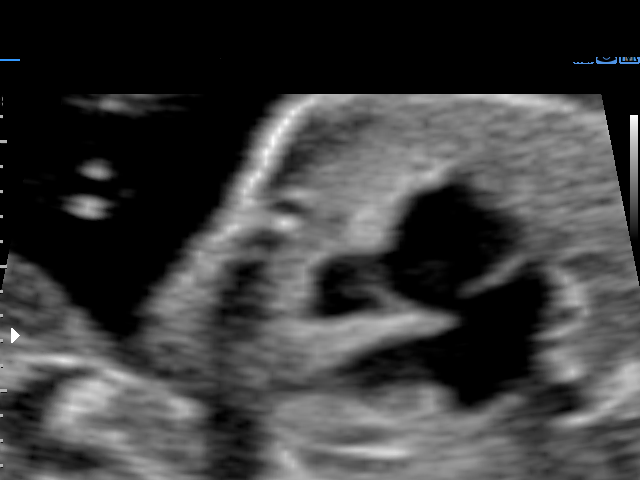
[im 74/100]
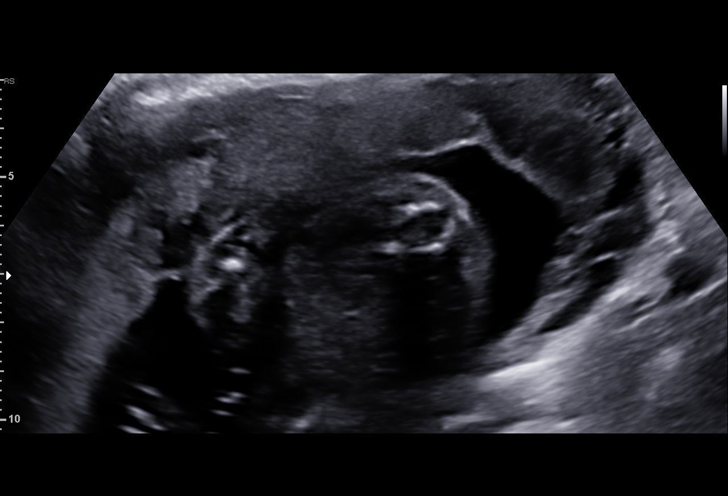
[im 81/100]
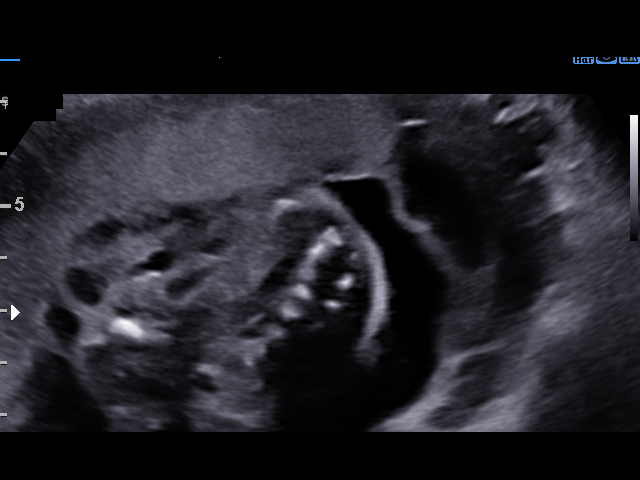
[im 89/100]
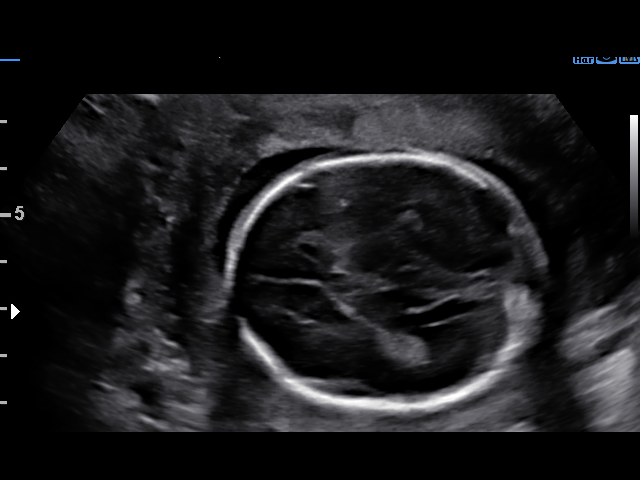
[im 96/100]
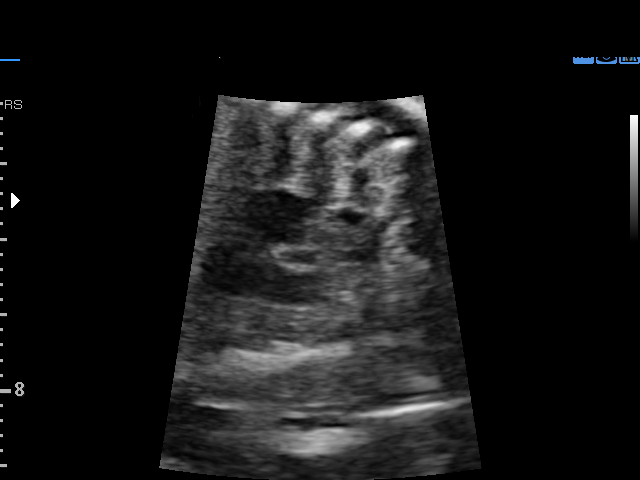

[13 of 28 positions shown; findings below may reference images not displayed]

[REDACTED]. [HOSPITAL]
DO

1  DIMA-INGA MAMAVCA             [id number]      [id number]     [id number]
Indications

21 weeks gestation of pregnancy
Encounter for antenatal screening for
malformations
Abnormal biochemical screen: elevated
MSAFP, MoM
Pregnancy resulting from assisted
reproductive technology
OB History

Gravidity:    1
Fetal Evaluation

Num Of Fetuses:     1
Fetal Heart         155
Rate(bpm):
Cardiac Activity:   Observed
Presentation:       Cephalic
Placenta:           Anterior, above cervical os
P. Cord Insertion:  Visualized, central

Amniotic Fluid
AFI FV:      Subjectively within normal limits

Largest Pocket(cm)
3.93
Biometry

BPD:      52.8  mm     G. Age:  22w 0d         67  %    CI:         74.01  %    70 - 86
FL/HC:       17.1  %    18.4 -
HC:      194.9  mm     G. Age:  21w 5d         46  %    HC/AC:       1.11       1.06 -
AC:      175.7  mm     G. Age:  22w 3d         72  %    FL/BPD:      63.3  %    71 - 87
FL:       33.4  mm     G. Age:  20w 3d         11  %    FL/AC:       19.0  %    20 - 24
HUM:      32.5  mm     G. Age:  20w 6d         32  %
CER:      23.6  mm     G. Age:  21w 6d         57  %

CM:        4.3  mm

Est. FW:     437   gm    0 lb 15 oz     46  %
Gestational Age

Clinical EDD:  21w 4d                                        EDD:    07/09/17
U/S Today:     21w 5d                                        EDD:    07/08/17
Best:          21w 4d     Det. By:  Clinical EDD             EDD:    07/09/17
Anatomy

Cranium:               Appears normal         Aortic Arch:            Appears normal
Cavum:                 Appears normal         Ductal Arch:            Not well visualized
Ventricles:            Appears normal         Diaphragm:              Appears normal
Choroid Plexus:        Appears normal         Stomach:                Appears normal, left
sided
Cerebellum:            Appears normal         Abdomen:                Appears normal
Posterior Fossa:       Appears normal         Abdominal Wall:         Appears nml (cord
insert, abd wall)
Nuchal Fold:           Not applicable (>20    Cord Vessels:           Appears normal (3
wks GA)                                        vessel cord)
Face:                  Orbits nl; profile not Kidneys:                Appear normal
well visualized
Lips:                  Appears normal         Bladder:                Appears normal
Thoracic:              Appears normal         Spine:                  Appears normal
Heart:                 Appears normal         Upper Extremities:      Appears normal
(4CH, axis, and
situs)
RVOT:                  Appears normal         Lower Extremities:      Appears normal
LVOT:                  Appears normal

Other:  Parents do not wish to know sex of fetus. Heels and left 5th digit
visualized. Technically difficult due to fetal position.
Cervix Uterus Adnexa

Cervix
Length:           3.55  cm.
Normal appearance by transabdominal scan.

Uterus
No abnormality visualized.

Left Ovary
Within normal limits.

Right Ovary
Not visualized.

Adnexa:       No abnormality visualized. No adnexal mass
visualized.
Impression

Singleton intrauterine pregnancy at 21 weeks 4 days
gestation with fetal cardiac activity
Cephalic presentation
Normal detailed fetal anatomy; limited views of profile and DA
Markers of aneuploidy: none
Normal amniotic fluid volume
Measurements consistent with stated EDC
Anterior placenta
Normal appearing cervical length

The US findings were shared with Ms. Kong. The
implications of an elevated MSAFP  were discussed in detail.
Fortunately, no structural abnormalities were identified to
explain the elevated MSAFP. Specifically, the spine, posterior
fossa and abdominal wall were seen and appeared normal.
Recommendations

Serial ultrasounds for growth in the third trimester
Close monitoring for third trimester maternal-fetal
complications

## 2018-10-15 ENCOUNTER — Ambulatory Visit (INDEPENDENT_AMBULATORY_CARE_PROVIDER_SITE_OTHER): Payer: 59 | Admitting: Physician Assistant

## 2018-10-15 ENCOUNTER — Encounter: Payer: Self-pay | Admitting: Physician Assistant

## 2018-10-15 ENCOUNTER — Other Ambulatory Visit: Payer: Self-pay

## 2018-10-15 DIAGNOSIS — F3342 Major depressive disorder, recurrent, in full remission: Secondary | ICD-10-CM | POA: Diagnosis not present

## 2018-10-15 DIAGNOSIS — F411 Generalized anxiety disorder: Secondary | ICD-10-CM

## 2018-10-15 MED ORDER — SERTRALINE HCL 100 MG PO TABS: 100.0000 mg | ORAL_TABLET | Freq: Every day | ORAL | 1 refills | Status: DC

## 2018-10-15 MED ORDER — ALPRAZOLAM 0.5 MG PO TABS: 0.5000 mg | ORAL_TABLET | Freq: Two times a day (BID) | ORAL | 5 refills | Status: DC | PRN

## 2018-10-15 NOTE — Progress Notes (Signed)
[facility]  Patient ID: Breanna Murray,  MRN: [id number]  PCP: Fransisca Connors, PA-C  Date of Evaluation: 10/15/2018 Time spent:15 minutes  Chief Complaint:  Chief Complaint    Follow-up     Virtual Visit via Telephone Note  I connected with patient by a video enabled telemedicine application or telephone, with their informed consent, and verified patient privacy and that I am speaking with the correct person using two identifiers.  I am private, in my home and the patient is home.   I discussed the limitations, risks, security and privacy concerns of performing an evaluation and management service by telephone and the availability of in person appointments. I also discussed with the patient that there may be a patient responsible charge related to this service. The patient expressed understanding and agreed to proceed.   I discussed the assessment and treatment plan with the patient. The patient was provided an opportunity to ask questions and all were answered. The patient agreed with the plan and demonstrated an understanding of the instructions.   The patient was advised to call back or seek an in-person evaluation if the symptoms worsen or if the condition fails to improve as anticipated.  I provided 15 minutes of non-face-to-face time during this encounter.  HISTORY/CURRENT STATUS: HPI For 6 month med check.   Doing well.  Still staying home w/ her 2 month old son, Breanna Murray.   Back in April, she thought she was doing well so weaned herself off her meds.  That didn't go well.  So she restarted the Zoloft and the Xanax.  They are working well now.  She has needed the Xanax more often in the past few months.  Often in the evening, she cannot get her mind to shut off.  Taking the Xanax helps and then she can fall asleep.  She does get good rest.  Patient denies loss of interest in usual activities and is able to enjoy things.  Denies decreased energy or motivation.   Appetite has not changed.  No extreme sadness, tearfulness, or feelings of hopelessness.  Denies any changes in concentration, making decisions or remembering things.  Denies suicidal or homicidal thoughts.  Denies dizziness, syncope, seizures, numbness, tingling, tremor, tics, unsteady gait, slurred speech, confusion. Denies muscle or joint pain, stiffness, or dystonia.  Individual Medical History/ Review of Systems: Changes? :No    Allergies: Bee venom, Metaxalone, Compazine [prochlorperazine edisylate], and Dexamethasone  Current Medications:  Current Outpatient Medications:  .  ALPRAZolam (XANAX) 0.5 MG tablet, Take 1 tablet (0.5 mg total) by mouth 2 (two) times daily as needed for anxiety., Disp: 60 tablet, Rfl: 5 .  sertraline (ZOLOFT) 100 MG tablet, Take 1 tablet (100 mg total) by mouth daily., Disp: 90 tablet, Rfl: 1 Medication Side Effects: none  Family Medical/ Social History: Changes? Yes her husband has been working from home due to the [place] pandemic.  MENTAL HEALTH EXAM:  There were no vitals taken for this visit.There is no height or weight on file to calculate BMI.  General Appearance: Unable to assess  Eye Contact:  Unable to assess  Speech:  Clear and Coherent  Volume:  Normal  Mood:  Euthymic  Affect:  Unable to assess  Thought Process:  Goal Directed  Orientation:  Full (Time, Place, and Person)  Thought Content: Logical   Suicidal Thoughts:  No  Homicidal Thoughts:  No  Memory:  WNL  Judgement:  Good  Insight:  Good  Psychomotor Activity:  Unable to assess  Concentration:  Concentration: Good  Recall:  Good  Fund of Knowledge: Good  Language: Good  Assets:  Desire for Improvement  ADL's:  Intact  Cognition: WNL  Prognosis:  Good    DIAGNOSES:    ICD-10-CM   1. Recurrent major depressive disorder, in full remission (HCC)  F33.42   2. Generalized anxiety disorder  F41.1     Receiving Psychotherapy: No    RECOMMENDATIONS:  Continue Zoloft 100 mg  1 daily. Continue Xanax 0.5 mg twice daily as needed. Discussed the fact that this is not a good time to go off medications due to the stressors in the world.  She agrees and will let me know in the future if she wants to wean off the meds. Return in 6 months.  Melony Overlyeresa Hurst, PA-C   This record has been created using [employer] software.  Chart creation errors have been sought, but may not always have been located and corrected. Such creation errors do not reflect on the standard of medical care.

## 2019-02-23 ENCOUNTER — Inpatient Hospital Stay (HOSPITAL_COMMUNITY)
Admission: AD | Admit: 2019-02-23 | Discharge: 2019-02-23 | Disposition: A | Discharge status: Home / Self Care | Payer: 59 | Attending: Obstetrics and Gynecology | Admitting: Obstetrics and Gynecology

## 2019-02-23 ENCOUNTER — Inpatient Hospital Stay (HOSPITAL_COMMUNITY): Payer: 59

## 2019-02-23 ENCOUNTER — Other Ambulatory Visit: Payer: Self-pay

## 2019-02-23 ENCOUNTER — Encounter (HOSPITAL_COMMUNITY): Payer: Self-pay | Admitting: Obstetrics and Gynecology

## 2019-02-23 DIAGNOSIS — F329 Major depressive disorder, single episode, unspecified: Secondary | ICD-10-CM | POA: Diagnosis not present

## 2019-02-23 DIAGNOSIS — Z91030 Bee allergy status: Secondary | ICD-10-CM | POA: Insufficient documentation

## 2019-02-23 DIAGNOSIS — Z87891 Personal history of nicotine dependence: Secondary | ICD-10-CM | POA: Insufficient documentation

## 2019-02-23 DIAGNOSIS — Z79899 Other long term (current) drug therapy: Secondary | ICD-10-CM | POA: Insufficient documentation

## 2019-02-23 DIAGNOSIS — O26891 Other specified pregnancy related conditions, first trimester: Secondary | ICD-10-CM | POA: Diagnosis not present

## 2019-02-23 DIAGNOSIS — O034 Incomplete spontaneous abortion without complication: Secondary | ICD-10-CM | POA: Insufficient documentation

## 2019-02-23 DIAGNOSIS — R109 Unspecified abdominal pain: Secondary | ICD-10-CM | POA: Diagnosis present

## 2019-02-23 DIAGNOSIS — Z3A10 10 weeks gestation of pregnancy: Secondary | ICD-10-CM | POA: Diagnosis not present

## 2019-02-23 DIAGNOSIS — O26899 Other specified pregnancy related conditions, unspecified trimester: Secondary | ICD-10-CM | POA: Diagnosis present

## 2019-02-23 DIAGNOSIS — F419 Anxiety disorder, unspecified: Secondary | ICD-10-CM | POA: Diagnosis not present

## 2019-02-23 DIAGNOSIS — Z888 Allergy status to other drugs, medicaments and biological substances status: Secondary | ICD-10-CM | POA: Insufficient documentation

## 2019-02-23 DIAGNOSIS — O209 Hemorrhage in early pregnancy, unspecified: Secondary | ICD-10-CM | POA: Diagnosis present

## 2019-02-23 DIAGNOSIS — O021 Missed abortion: Secondary | ICD-10-CM

## 2019-02-23 LAB — CBC
HCT: 40 % (ref 36.0–46.0)
Hemoglobin: 13.3 g/dL (ref 12.0–15.0)
MCH: 28.9 pg (ref 26.0–34.0)
MCHC: 33.3 g/dL (ref 30.0–36.0)
MCV: 86.8 fL (ref 80.0–100.0)
Platelets: 315 10*3/uL (ref 150–400)
RBC: 4.61 MIL/uL (ref 3.87–5.11)
RDW: 13.7 % (ref 11.5–15.5)
WBC: 9.4 10*3/uL (ref 4.0–10.5)
nRBC: 0 % (ref 0.0–0.2)

## 2019-02-23 LAB — URINALYSIS, ROUTINE W REFLEX MICROSCOPIC
Bacteria, UA: NONE SEEN
Bilirubin Urine: NEGATIVE
Glucose, UA: NEGATIVE mg/dL
Ketones, ur: NEGATIVE mg/dL
Leukocytes,Ua: NEGATIVE
Nitrite: NEGATIVE
Protein, ur: NEGATIVE mg/dL
Specific Gravity, Urine: 1.002 — ABNORMAL LOW (ref 1.005–1.030)
pH: 8 (ref 5.0–8.0)

## 2019-02-23 LAB — WET PREP, GENITAL
Clue Cells Wet Prep HPF POC: NONE SEEN
Sperm: NONE SEEN
Trich, Wet Prep: NONE SEEN
Yeast Wet Prep HPF POC: NONE SEEN

## 2019-02-23 LAB — HCG, QUANTITATIVE, PREGNANCY: hCG, Beta Chain, Quant, S: 1757 m[IU]/mL — ABNORMAL HIGH (ref <5)

## 2019-02-23 LAB — POCT PREGNANCY, URINE: Preg Test, Ur: POSITIVE — AB

## 2019-02-23 MED ORDER — MISOPROSTOL 200 MCG PO TABS
800.0000 ug | ORAL_TABLET | Freq: Once | ORAL | Status: AC
  Administered 2019-02-23: 800 ug via VAGINAL
  Filled 2019-02-23: qty 4

## 2019-02-23 MED ORDER — METOCLOPRAMIDE HCL 10 MG PO TABS: 10.0000 mg | ORAL_TABLET | Freq: Four times a day (QID) | ORAL | 0 refills | Status: DC

## 2019-02-23 MED ORDER — IBUPROFEN 600 MG PO TABS: 600.0000 mg | ORAL_TABLET | Freq: Four times a day (QID) | ORAL | 0 refills | Status: DC | PRN

## 2019-02-23 MED ORDER — ACETAMINOPHEN-CODEINE #3 300-30 MG PO TABS: 1.0000 | ORAL_TABLET | Freq: Four times a day (QID) | ORAL | 0 refills | Status: AC | PRN

## 2019-02-23 MED ORDER — OXYCODONE-ACETAMINOPHEN 5-325 MG PO TABS
2.0000 | ORAL_TABLET | Freq: Once | ORAL | Status: AC
  Administered 2019-02-23: 2 via ORAL
  Filled 2019-02-23: qty 2

## 2019-02-23 NOTE — MAU Provider Note (Addendum)
History     CSN: [id number]  Arrival date and time: 02/23/19 1324   First Provider Initiated Contact with Patient 02/23/19 1903      Chief Complaint  Patient presents with  . Pelvic Pain  . Back Pain  . Vaginal Bleeding   Breanna Murray is a 28 y.o. G3P1011 at [redacted]w[redacted]d who presents today with vaginal bleeding and cramping. Patient is going to [facility] for care. Next OB visit is 02/25/2019.  Pelvic Pain The patient's primary symptoms include pelvic pain and vaginal bleeding. The patient's pertinent negatives include no vaginal discharge. This is a new problem. The current episode started yesterday. The problem occurs intermittently. The problem affects both sides. She is pregnant. Associated symptoms include back pain. Pertinent negatives include no chills, dysuria, fever, frequency, nausea or vomiting. The vaginal bleeding is lighter than menses. She has been passing clots. She has not been passing tissue. She has tried acetaminophen for the symptoms. The treatment provided mild relief. Her menstrual history has been regular (LMP 12/07/2018).  Back Pain This is a new problem. The current episode started in the past 7 days. The problem occurs intermittently. The pain is at a severity of 7/10. Associated symptoms include pelvic pain. Pertinent negatives include no dysuria or fever. Treatments tried: tylenol  The treatment provided mild relief.  Vaginal Bleeding The patient's primary symptoms include pelvic pain and vaginal bleeding. The patient's pertinent negatives include no vaginal discharge. Associated symptoms include back pain. Pertinent negatives include no chills, dysuria, fever, frequency, nausea or vomiting.    OB History    Gravida  3   Para  1   Term  1   Preterm      AB  1   Living  1     SAB  1   TAB      Ectopic      Multiple      Live Births  1           Past Medical History:  Diagnosis Date  . Anxiety   . Depression   . Endometriosis      Past Surgical History:  Procedure Laterality Date  . CESAREAN SECTION N/A 06/19/2017   Procedure: CESAREAN SECTION;  Surgeon: Brien Few, MD;  Location: [facility];  Service: Obstetrics;  Laterality: N/A;  . CHROMOPERTUBATION Bilateral 05/22/2015   Procedure: CHROMOPERTUBATION;  Surgeon: Linda Hedges, DO;  Location: [facility];  Service: Gynecology;  Laterality: Bilateral;  . DIAGNOSTIC LAPAROSCOPY    . LAPAROSCOPY N/A 05/22/2015   Procedure: LAPAROSCOPY DIAGNOSTIC, removal of endometriosis, Bladder Instillation;  Surgeon: Linda Hedges, DO;  Location: [facility];  Service: Gynecology;  Laterality: N/A;  . TONSILLECTOMY      Family History  Problem Relation Age of Onset  . Hypertension Mother   . Hypertension Father     Social History   Tobacco Use  . Smoking status: Former Smoker    Types: Cigarettes    Quit date: 05/13/2010    Years since quitting: 8.7  . Smokeless tobacco: Never Used  . Tobacco comment: smoked cigs in high school and college only socialy  Substance Use Topics  . Alcohol use: Not Currently    Alcohol/week: 3.0 standard drinks    Types: 3 Glasses of wine per week  . Drug use: No    Allergies:  Allergies  Allergen Reactions  . Bee Venom   . Metaxalone Hives  . Compazine [Prochlorperazine Edisylate] Anxiety    Jumping out of body   .  Dexamethasone Anxiety    Medications Prior to Admission  Medication Sig Dispense Refill Last Dose  . ALPRAZolam (XANAX) 0.5 MG tablet Take 1 tablet (0.5 mg total) by mouth 2 (two) times daily as needed for anxiety. 60 tablet 5   . sertraline (ZOLOFT) 100 MG tablet Take 1 tablet (100 mg total) by mouth daily. 90 tablet 1     Review of Systems  Constitutional: Negative for chills and fever.  Gastrointestinal: Negative for nausea and vomiting.  Genitourinary: Positive for pelvic pain and vaginal bleeding. Negative for dysuria, frequency and vaginal discharge.  Musculoskeletal: Positive for back pain.   Physical  Exam   Blood pressure (!) 142/94, pulse 81, temperature 98.3 F (36.8 C), temperature source Oral, resp. rate 16, weight 77.1 kg, last menstrual period 12/07/2018, SpO2 100 %, not currently breastfeeding.  Physical Exam  Nursing note and vitals reviewed. Constitutional: She is oriented to person, place, and time. She appears well-developed and well-nourished. No distress.  HENT:  Head: Normocephalic.  Cardiovascular: Normal rate.  Respiratory: Effort normal.  GI: Soft. There is abdominal tenderness. There is no rebound.  Genitourinary:    Genitourinary Comments: No CVA tenderness    Neurological: She is alert and oriented to person, place, and time.  Skin: Skin is warm and dry.  Psychiatric: She has a normal mood and affect.    MAU Course  Procedures  MDM  Care turned over to Raelyn Mora, CNM at [facility] DNP, CNM  02/23/19  8:01 PM   Orders placed prior to R. Arita Miss, CNM: CCUA UPT Wet Prep GC/CT -- results pending CBC HCG Percocet 5/325 mg x 2 tablets given -- pain improved  Early Intrauterine Pregnancy Failure Protocol X  Documented intrauterine pregnancy failure less than or equal to [redacted] weeks   gestation  X  No serious current illness  X  Baseline Hgb greater than or equal to 10g/dl  X  Patient has easily accessible transportation to the hospital  X  Clear preference  X  Practitioner/physician deems patient reliable  X  Counseling by practitioner or physician  X  Patient education by RN  X  Consent form signed       Rho-Gam given by RN if indicated  X  Medication dispensed  X  Cytotec 800 mcg Intravaginally  X  Intravaginally by RN in MAU       Rectally by patient at home       Rectally by RN in MAU  X   Ibuprofen 600 mg 1 tablet by mouth every 6 hours as needed #30 - prescribed  X   Tylenol #3 mg by mouth every 4 to 6 hours as needed - prescribed  X   Phenergan 12.5 mg by mouth every 4 hours as needed for nausea - prescribed    Results for  orders placed or performed during the hospital encounter of 02/23/19 (from the past 24 hour(s))  Urinalysis, Routine w reflex microscopic     Status: Abnormal   Collection Time: 02/23/19  6:42 PM  Result Value Ref Range   Color, Urine COLORLESS (A) YELLOW   APPearance CLEAR CLEAR   Specific Gravity, Urine 1.002 (L) 1.005 - 1.030   pH 8.0 5.0 - 8.0   Glucose, UA NEGATIVE NEGATIVE mg/dL   Hgb urine dipstick MODERATE (A) NEGATIVE   Bilirubin Urine NEGATIVE NEGATIVE   Ketones, ur NEGATIVE NEGATIVE mg/dL   Protein, ur NEGATIVE NEGATIVE mg/dL   Nitrite NEGATIVE NEGATIVE   Leukocytes,Ua  NEGATIVE NEGATIVE   RBC / HPF 0-5 0 - 5 RBC/hpf   WBC, UA 0-5 0 - 5 WBC/hpf   Bacteria, UA NONE SEEN NONE SEEN   Squamous Epithelial / LPF 0-5 0 - 5  Pregnancy, urine POC     Status: Abnormal   Collection Time: 02/23/19  6:43 PM  Result Value Ref Range   Preg Test, Ur POSITIVE (A) NEGATIVE  Wet prep, genital     Status: Abnormal   Collection Time: 02/23/19  7:29 PM   Specimen: Vaginal  Result Value Ref Range   Yeast Wet Prep HPF POC NONE SEEN NONE SEEN   Trich, Wet Prep NONE SEEN NONE SEEN   Clue Cells Wet Prep HPF POC NONE SEEN NONE SEEN   WBC, Wet Prep HPF POC FEW (A) NONE SEEN   Sperm NONE SEEN   CBC     Status: None   Collection Time: 02/23/19  7:59 PM  Result Value Ref Range   WBC 9.4 4.0 - 10.5 K/uL   RBC 4.61 3.87 - 5.11 MIL/uL   Hemoglobin 13.3 12.0 - 15.0 g/dL   HCT 16.140.0 09.636.0 - 04.546.0 %   MCV 86.8 80.0 - 100.0 fL   MCH 28.9 26.0 - 34.0 pg   MCHC 33.3 30.0 - 36.0 g/dL   RDW 40.913.7 81.111.5 - 91.415.5 %   Platelets 315 150 - 400 K/uL   nRBC 0.0 0.0 - 0.2 %  hCG, quantitative, pregnancy     Status: Abnormal   Collection Time: 02/23/19  7:59 PM  Result Value Ref Range   hCG, Beta Chain, Quant, S 1,757 (H) <5 mIU/mL    US OB Comp Less 14 Wks  Result Date: 02/23/2019 CLINICAL DATA:  28 year old female with vaginal bleeding. LMP: 12/07/2018 corresponding to an estimated gestational age of [redacted] weeks,  2 days. EXAM: OBSTETRIC <14 WK US AND TRANSVAGINAL OB US TECHNIQUE: Both transabdominal and transvaginal ultrasound examinations were performed for complete evaluation of the gestation as well as the maternal uterus, adnexal regions, and pelvic cul-de-sac. Transvaginal technique was performed to assess early pregnancy. COMPARISON:  None. FINDINGS: Intrauterine gestational sac: Single intrauterine gestational sac. Yolk sac:  Seen Embryo:  Present Cardiac Activity: Not detected. Heart Rate: N/A  bpm CRL:  17 mm   8 w   0 d                  US EDC: 10/05/2019 Subchorionic hemorrhage:  None visualized. Maternal uterus/adnexae: The maternal ovaries are unremarkable. There is a corpus luteum in the right ovary. IMPRESSION: Single intrauterine pregnancy with an estimated gestational age of [redacted] weeks, 0 days. No fetal cardiac activity identified. Findings diagnostic for failed early pregnancy. These results were called by telephone at the time of interpretation on 02/23/2019 at 8:34 pm to Midwife Denton Meekolita Analea Muller, who verbally acknowledged these results. Electronically Signed   By: Elgie CollardArash  Radparvar M.D.   On: 02/23/2019 20:58    Assessment and Plan  Abdominal pain complicating pregnancy  - Rx Tylenol #3 and Ibuprofen 600 mg - Information provided on abdominal pain in pregnancy   Vaginal bleeding affecting early pregnancy  - Advised that there is some VB to be expected with the process of passing an incomplete miscarriage - Information provided on vaginal bleeding in first trimester pregnancy  - Return to MAU:  If you have heavier bleeding that soaks through more that 2 pads per hour for an hour or more  If you bleed so much that you  feel like you might pass out or you do pass out  If you have significant abdominal pain that is not improved with Tylenol   If you develop a fever > 100.5  Incomplete miscarriage - Reviewed with pt cytotec procedure. Pt verbalizes that she lives close to the hospital and has  transportation readily available.  Pt appears reliable and verbalizes understanding and agrees with plan of care - Cytotec given prior to d/c home  - Discharge patient - Advised to F/U with [facility] for rpt HCG in 1 week and see a provider in 2 weeks - Patient verbalized an understanding of the plan of care and agrees.   Raelyn Mora, CNM  02/23/2019 8:30 PM

## 2019-02-23 NOTE — Discharge Instructions (Signed)
Return to MAU:  If you have heavier bleeding that soaks through more that 2 pads per hour for an hour or more  If you bleed so much that you feel like you might pass out or you do pass out  If you have significant abdominal pain that is not improved with Tylenol #3 or Ibuprofen  If you develop a fever > 100.5  

## 2019-02-23 NOTE — MAU Note (Signed)
Breanna Murray is a 28 y.o. at Unknown here in MAU reporting:  +vaginal bleeding. Spotting with clots. Dr on call advised her to come in.  +lower back and abdominal cramping.  LMP: 12/07/2018. EDD given in office 09/19/2019 after ultrasound Onset of complaint: yesterday Pain score: 5/10. Has tried tylenol with no relief. Vitals:   02/23/19 1836  BP: (!) 142/94  Pulse: 81  Resp: 16  Temp: 98.3 F (36.8 C)  SpO2: 100%     Lab orders placed from triage: UA AND POC pregnancy test

## 2019-02-25 LAB — GC/CHLAMYDIA PROBE AMP (~~LOC~~) NOT AT ARMC
Chlamydia: NEGATIVE
Comment: NEGATIVE
Comment: NORMAL
Neisseria Gonorrhea: NEGATIVE

## 2019-03-19 ENCOUNTER — Other Ambulatory Visit: Payer: Self-pay | Admitting: Physician Assistant

## 2019-05-22 ENCOUNTER — Telehealth: Payer: Self-pay | Admitting: Physician Assistant

## 2019-05-22 NOTE — Telephone Encounter (Signed)
Pt wants return call to go over medications.  Callback # [phone]

## 2019-05-23 NOTE — Telephone Encounter (Signed)
Patient aware.

## 2019-05-23 NOTE — Telephone Encounter (Signed)
It is possible but unlikely given that she has been on this med for 1-1/2 years.  I have not seen her since August so she really needs to make an appointment to discuss this or other options.

## 2020-02-18 ENCOUNTER — Other Ambulatory Visit: Payer: Self-pay | Admitting: Obstetrics and Gynecology

## 2020-02-18 DIAGNOSIS — N63 Unspecified lump in unspecified breast: Secondary | ICD-10-CM

## 2020-02-29 NOTE — L&D Delivery Note (Addendum)
Delivery Note-VBAC At 2:48 PM a viable and healthy female was delivered via Vaginal, Spontaneous (Presentation: Left Occiput Anterior).  APGAR: 8, 9; weight 7 lb 3 oz (3260 g).   Placenta status: Spontaneous, Intact.  Cord: 3 vessels with the following complications: None.  Cord pH: na  Anesthesia: Epidural Episiotomy: None Lacerations:  right periurethral Suture Repair: 2.0 3.0 vicryl rapide Est. Blood Loss (mL): 200  Mom to postpartum.  Baby to Couplet care / Skin to Skin.  Narek Kniss J 01/07/2021, 4:37 PM

## 2020-03-19 ENCOUNTER — Other Ambulatory Visit: Payer: PRIVATE HEALTH INSURANCE

## 2020-06-22 LAB — OB RESULTS CONSOLE HEPATITIS B SURFACE ANTIGEN: Hepatitis B Surface Ag: NEGATIVE

## 2020-06-22 LAB — OB RESULTS CONSOLE RUBELLA ANTIBODY, IGM: Rubella: IMMUNE

## 2020-06-22 LAB — OB RESULTS CONSOLE HIV ANTIBODY (ROUTINE TESTING): HIV: NONREACTIVE

## 2020-06-22 LAB — OB RESULTS CONSOLE RPR: RPR: NONREACTIVE

## 2020-06-29 LAB — OB RESULTS CONSOLE GC/CHLAMYDIA
Chlamydia: NEGATIVE
Gonorrhea: NEGATIVE

## 2020-09-25 IMAGING — US US OB COMP LESS 14 WK
1 series · 15 of 20 positions shown · non-contrast
Comparison: None.

CLINICAL DATA: 28-year-old female with vaginal bleeding. LMP:
12/07/2018 corresponding to an estimated gestational age of 10
weeks, 2 days.

EXAM:
OBSTETRIC <14 WK US AND TRANSVAGINAL OB US
TECHNIQUE: Both transabdominal and transvaginal ultrasound examinations were
performed for complete evaluation of the gestation as well as the
maternal uterus, adnexal regions, and pelvic cul-de-sac.
Transvaginal technique was performed to assess early pregnancy.

[Series 1: us ob comp less 14 wk · 15 of 20 slices shown]
[im 1/20]
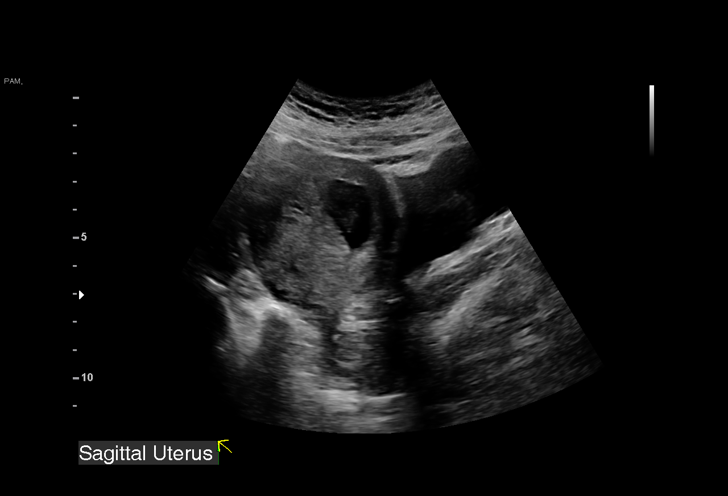
[im 3/20]
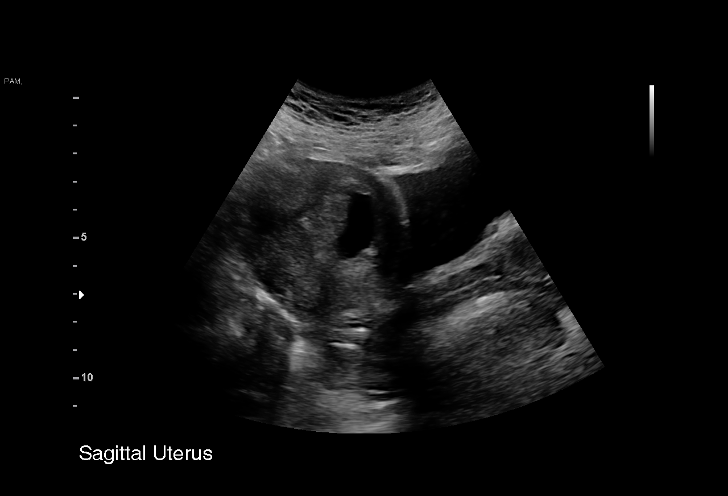
[im 4/20]
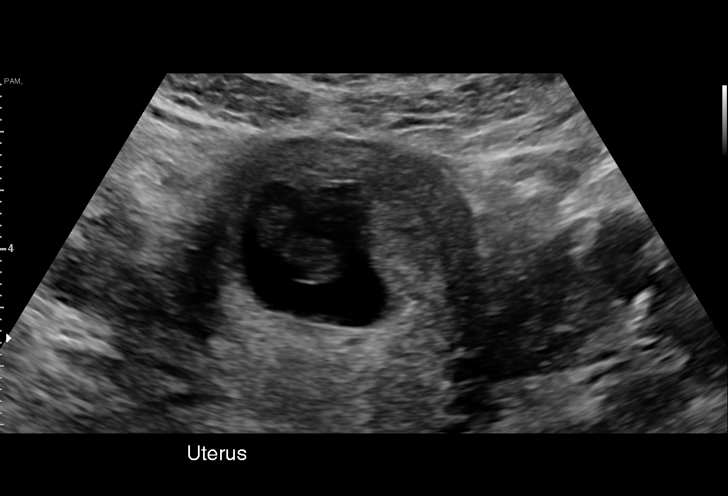
[im 5/20]
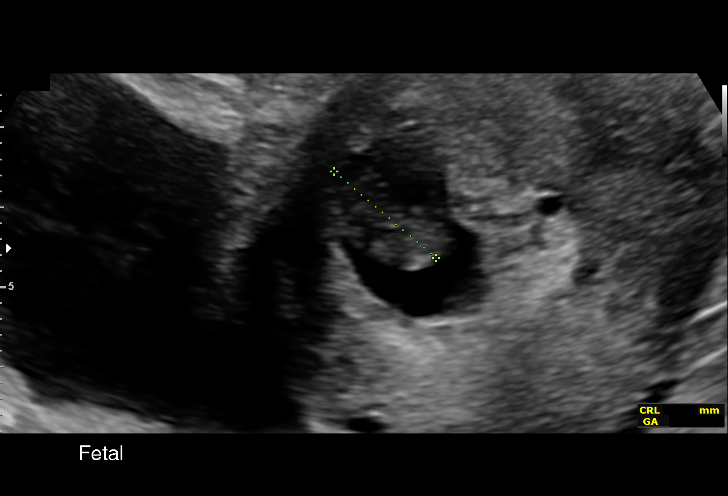
[im 7/20]
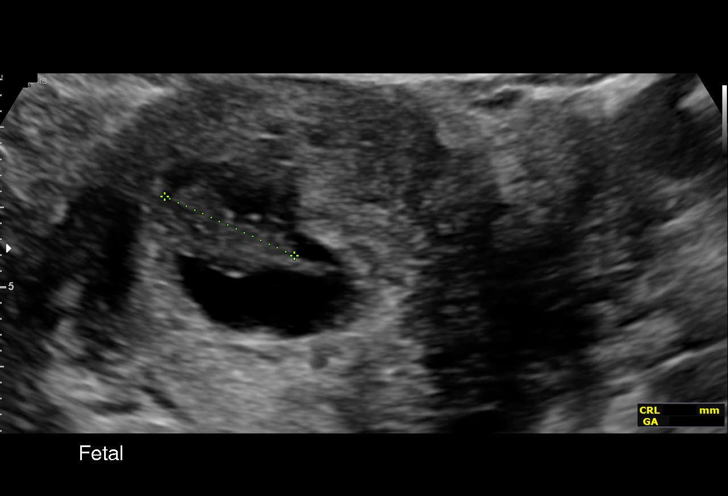
[im 8/20]
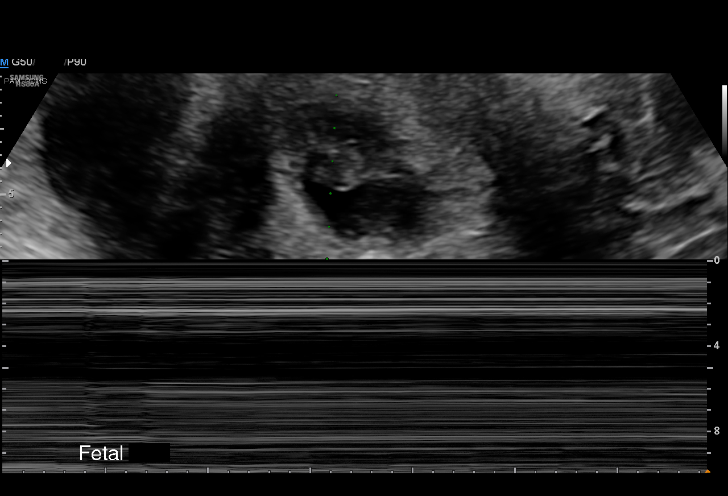
[im 9/20]
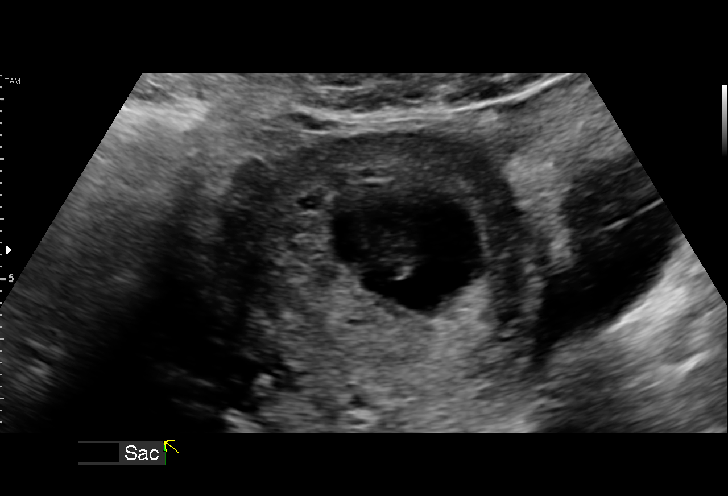
[im 11/20]
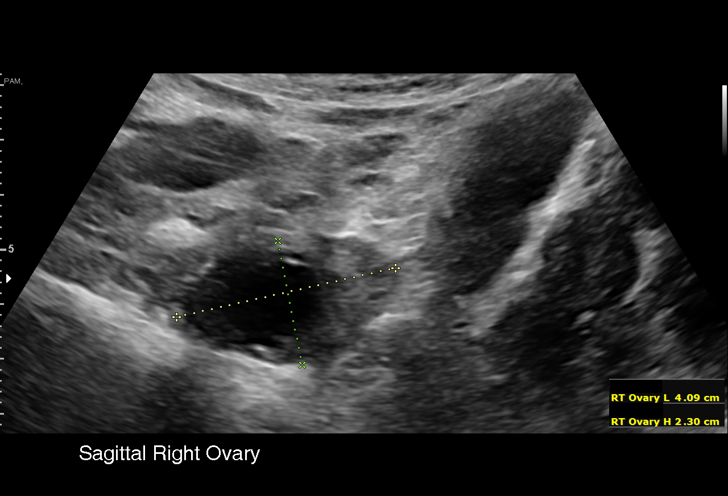
[im 12/20]
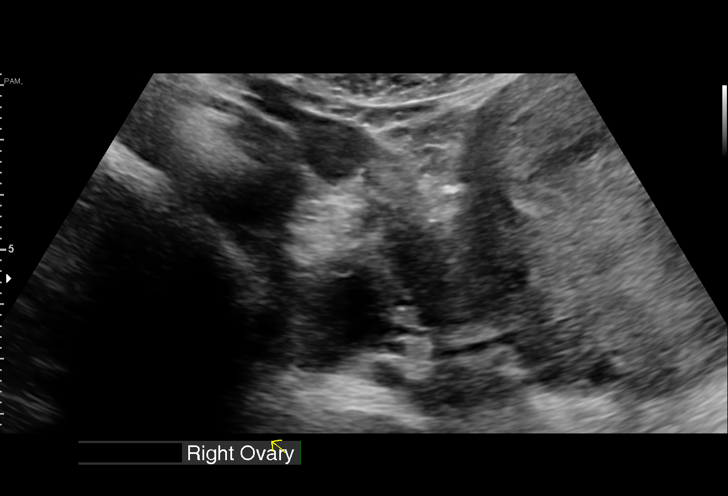
[im 13/20]
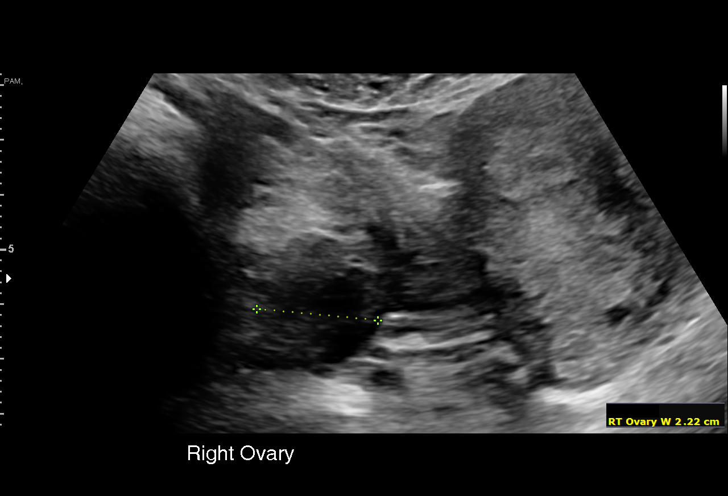
[im 15/20]
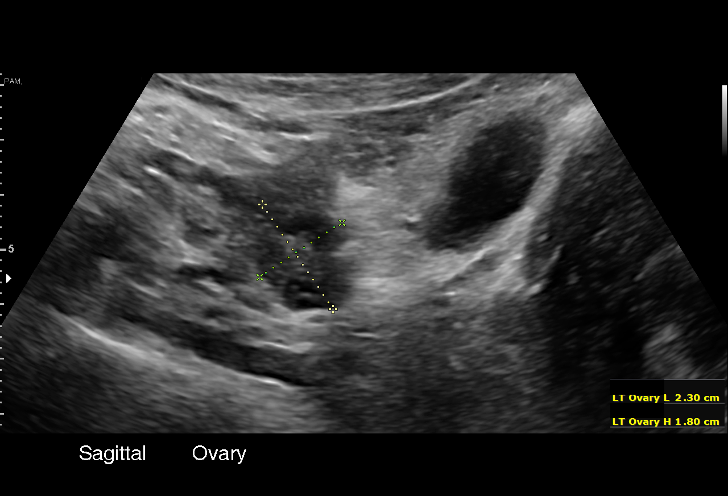
[im 16/20]
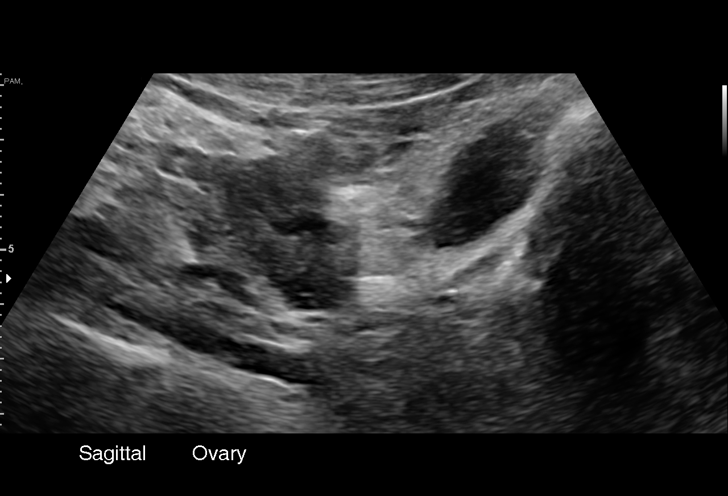
[im 17/20]
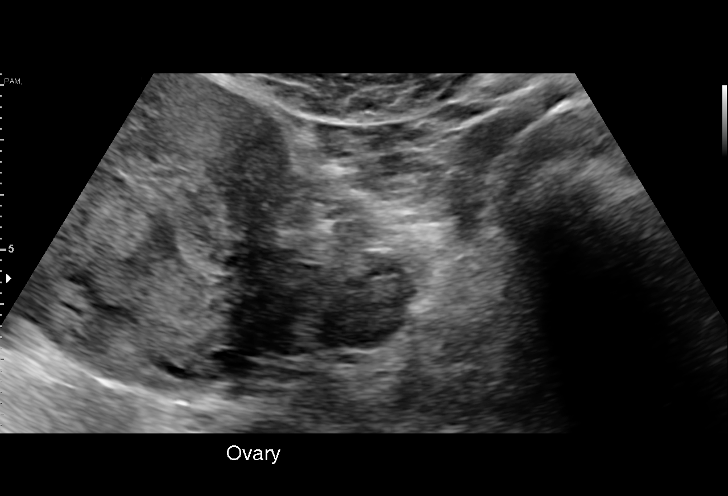
[im 19/20]
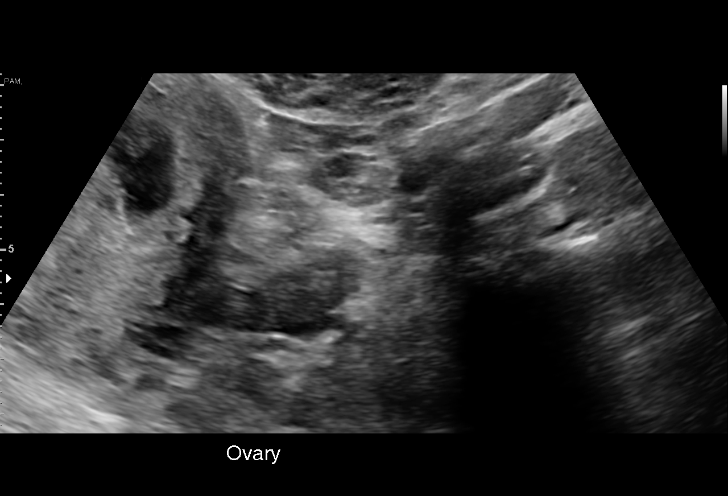
[im 20/20]
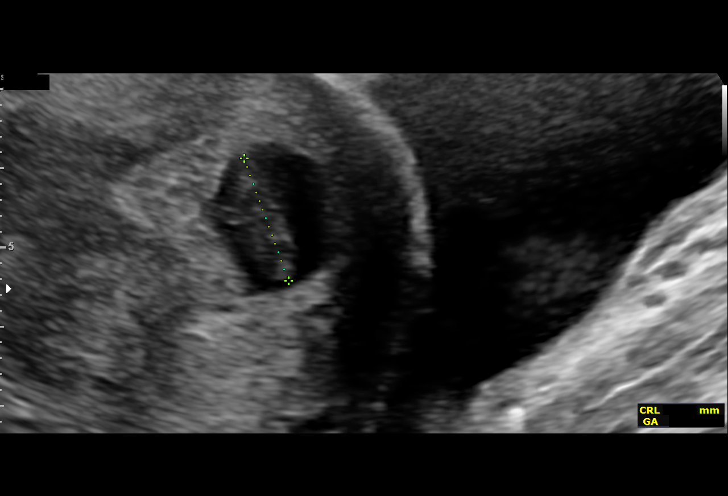

[15 of 20 positions shown; findings below may reference images not displayed]

FINDINGS: Intrauterine gestational sac: Single intrauterine gestational sac.

Yolk sac:  Seen

Embryo:  Present

Cardiac Activity: Not detected.

Heart Rate: N/A  bpm

CRL:  17 mm   8 w   0 d                  US EDC: 10/05/2019

Subchorionic hemorrhage:  None visualized.

Maternal uterus/adnexae: The maternal ovaries are unremarkable.
There is a corpus luteum in the right ovary.
IMPRESSION: Single intrauterine pregnancy with an estimated gestational age of 8
weeks, 0 days. No fetal cardiac activity identified. Findings
diagnostic for failed early pregnancy.

These results were called by telephone at the time of interpretation
on 02/23/2019 at [DATE] to [REDACTED] Keyli Tiger, who verbally
acknowledged these results.

## 2021-01-06 ENCOUNTER — Inpatient Hospital Stay (HOSPITAL_COMMUNITY)
Admission: AD | Admit: 2021-01-06 | Discharge: 2021-01-08 | DRG: 807 | Disposition: A | Discharge status: Home / Self Care | Payer: 59 | Attending: Obstetrics and Gynecology | Admitting: Obstetrics and Gynecology

## 2021-01-06 ENCOUNTER — Encounter (HOSPITAL_COMMUNITY): Payer: Self-pay

## 2021-01-06 DIAGNOSIS — Z87891 Personal history of nicotine dependence: Secondary | ICD-10-CM

## 2021-01-06 DIAGNOSIS — F411 Generalized anxiety disorder: Secondary | ICD-10-CM | POA: Diagnosis present

## 2021-01-06 DIAGNOSIS — E781 Pure hyperglyceridemia: Secondary | ICD-10-CM

## 2021-01-06 DIAGNOSIS — O4292 Full-term premature rupture of membranes, unspecified as to length of time between rupture and onset of labor: Principal | ICD-10-CM | POA: Diagnosis present

## 2021-01-06 DIAGNOSIS — O34219 Maternal care for unspecified type scar from previous cesarean delivery: Secondary | ICD-10-CM | POA: Diagnosis not present

## 2021-01-06 DIAGNOSIS — Z20822 Contact with and (suspected) exposure to covid-19: Secondary | ICD-10-CM | POA: Diagnosis present

## 2021-01-06 DIAGNOSIS — Z3A39 39 weeks gestation of pregnancy: Secondary | ICD-10-CM

## 2021-01-06 DIAGNOSIS — O34211 Maternal care for low transverse scar from previous cesarean delivery: Secondary | ICD-10-CM | POA: Diagnosis present

## 2021-01-06 DIAGNOSIS — O99344 Other mental disorders complicating childbirth: Secondary | ICD-10-CM | POA: Diagnosis present

## 2021-01-06 LAB — POCT FERN TEST: POCT Fern Test: NEGATIVE

## 2021-01-06 MED ORDER — CALCIUM CARBONATE ANTACID 500 MG PO CHEW
400.0000 mg | CHEWABLE_TABLET | Freq: Once | ORAL | Status: AC
  Administered 2021-01-07: 400 mg via ORAL
  Filled 2021-01-06: qty 2

## 2021-01-06 NOTE — MAU Provider Note (Signed)
Chief Complaint:  Rupture of Membranes   Event Date/Time   First Provider Initiated Contact with Patient 01/06/21 2351     HPI: Breanna Murray is a 30 y.o. [id number] at 32w3dwho presents to maternity admissions reporting leaking of fluid.  One BP noted to be elevated. . She reports good fetal movement, denies vaginal bleeding, vaginal itching/burning, urinary symptoms, h/a, dizziness, n/v, diarrhea, constipation or fever/chills.  She denies headache, visual changes or RUQ abdominal pain.  Vaginal Discharge The patient's primary symptoms include vaginal discharge. The patient's pertinent negatives include no genital itching, genital lesions, genital odor or vaginal bleeding. This is a new problem. The current episode started today. She is pregnant. Pertinent negatives include no back pain, chills, fever, headaches, nausea or vomiting. The vaginal discharge was clear and watery. There has been no bleeding. She has not been passing clots. She has not been passing tissue. Nothing aggravates the symptoms.   RN Note: SAYS SROM AT 10PM BABY IS PLANNED VBAC      GBS- NEG      VE YESTERDAY  CLOSED FEELS UC  Past Medical History: Past Medical History:  Diagnosis Date   Anxiety    Depression    Endometriosis     Past obstetric history: OB History  Gravida Para Term Preterm AB Living  4 1 1   2 1   SAB IAB Ectopic Multiple Live Births  2       1    # Outcome Date GA Lbr Len/2nd Weight Sex Delivery Anes PTL Lv  4 Current           3 Term 06/23/17 [redacted]w[redacted]d    CS-LTranv   LIV  2 SAB 2019          1 SAB             Past Surgical History: Past Surgical History:  Procedure Laterality Date   CESAREAN SECTION N/A 06/19/2017   Procedure: CESAREAN SECTION;  Surgeon: Brien Few, MD;  Location: [facility];  Service: Obstetrics;  Laterality: N/A;   CHROMOPERTUBATION Bilateral 05/22/2015   Procedure: CHROMOPERTUBATION;  Surgeon: Linda Hedges, DO;  Location: [facility];  Service: Gynecology;   Laterality: Bilateral;   DIAGNOSTIC LAPAROSCOPY     LAPAROSCOPY N/A 05/22/2015   Procedure: LAPAROSCOPY DIAGNOSTIC, removal of endometriosis, Bladder Instillation;  Surgeon: Linda Hedges, DO;  Location: [facility];  Service: Gynecology;  Laterality: N/A;   TONSILLECTOMY      Family History: Family History  Problem Relation Age of Onset   Hypertension Mother    Hypertension Father     Social History: Social History   Tobacco Use   Smoking status: Former    Types: Cigarettes    Quit date: 05/13/2010    Years since quitting: 10.6   Smokeless tobacco: Never   Tobacco comments:    smoked cigs in high school and college only socialy  Substance Use Topics   Alcohol use: Not Currently    Alcohol/week: 3.0 standard drinks    Types: 3 Glasses of wine per week   Drug use: No    Allergies:  Allergies  Allergen Reactions   Bee Venom    Metaxalone Hives   Compazine [Prochlorperazine Edisylate] Anxiety    Jumping out of body    Dexamethasone Anxiety    Meds:  Medications Prior to Admission  Medication Sig Dispense Refill Last Dose   acetaminophen (TYLENOL) 325 MG tablet Take 650 mg by mouth every 6 (six) hours as needed.  01/06/2021   buPROPion (WELLBUTRIN XL) 150 MG 24 hr tablet Take 150 mg by mouth daily.   01/06/2021   famotidine (PEPCID) 10 MG tablet Take 10 mg by mouth 2 (two) times daily.   01/06/2021   Prenatal Vit-Fe Fumarate-FA (PRENATAL MULTIVITAMIN) TABS tablet Take 1 tablet by mouth daily at 12 noon.   01/06/2021   ALPRAZolam (XANAX) 0.5 MG tablet Take 1 tablet (0.5 mg total) by mouth 2 (two) times daily as needed for anxiety. 60 tablet 5 More than a month   ibuprofen (ADVIL) 600 MG tablet Take 1 tablet (600 mg total) by mouth every 6 (six) hours as needed for cramping. 30 tablet 0 More than a month   metoCLOPramide (REGLAN) 10 MG tablet Take 1 tablet (10 mg total) by mouth every 6 (six) hours. 30 tablet 0 More than a month   sertraline (ZOLOFT) 100 MG tablet TAKE 1 TABLET BY  MOUTH EVERY DAY 30 tablet 5 More than a month    I have reviewed patient's Past Medical Hx, Surgical Hx, Family Hx, Social Hx, medications and allergies.   ROS:  Review of Systems  Constitutional:  Negative for chills and fever.  Gastrointestinal:  Negative for nausea and vomiting.  Genitourinary:  Positive for vaginal discharge.  Musculoskeletal:  Negative for back pain.  Neurological:  Negative for headaches.  Other systems negative  Physical Exam  Patient Vitals for the past 24 hrs:  BP Pulse SpO2  01/06/21 2345 132/84 (!) 102 96 %  01/06/21 2330 136/79 (!) 126 96 %  01/06/21 2315 (!) 132/93 (!) 118 96 %  01/06/21 2310 135/88 (!) 130 97 %   Constitutional: Well-developed, well-nourished female in no acute distress.  Cardiovascular: normal rate  Respiratory: normal effort GI: Abd soft, non-tender, gravid appropriate for gestational age.   No rebound or guarding. MS: Extremities nontender, no edema, normal ROM Neurologic: Alert and oriented x 4. DTRs 2+ with no clonus GU: Neg CVAT.  FHT:  Baseline 140 , moderate variability, accelerations present, no decelerations Contractions:  Irregular     Labs: Results for orders placed or performed during the hospital encounter of 01/06/21 (from the past 24 hour(s))  Amnisure rupture of membrane (rom)not at [facility]     Status: None   Collection Time: 01/06/21 11:33 PM  Result Value Ref Range   Amnisure ROM POSITIVE   Type and screen [place]     Status: None (Preliminary result)   Collection Time: 01/07/21 12:49 AM  Result Value Ref Range   ABO/RH(D) PENDING    Antibody Screen PENDING    Sample Expiration      01/10/2021,2359 Performed at [facility], [place] [address] [place], [place] [address]     Imaging:  Pt informed that the ultrasound is considered a limited OB ultrasound and is not intended to be a complete ultrasound exam.  Patient also informed that the ultrasound is not being completed with the  intent of assessing for fetal or placental anomalies or any pelvic abnormalities.  Explained that the purpose of today's ultrasound is to assess for presentation, BPP and amniotic fluid volume.  Patient acknowledges the purpose of the exam and the limitations of the study.   Fetus is vertex  MAU Course/MDM: I have ordered labs and reviewed results.  NST reviewed, reactive  Treatments in MAU included Amnisure, [place], EFM.    Assessment: Single IUP at [redacted]w[redacted]d PROM at term Intermittent elevated blood pressure  Plan: Admit to Labor and  Delivery Routine orders MD to follow  Wynelle Bourgeois CNM, MSN Certified Nurse-Midwife 01/06/2021 11:52 PM

## 2021-01-06 NOTE — MAU Note (Signed)
SAYS SROM AT 10PM BABY IS PLANNED VBAC  GBS- NEG VE YESTERDAY  CLOSED FEELS UC

## 2021-01-07 ENCOUNTER — Encounter (HOSPITAL_COMMUNITY): Payer: Self-pay | Admitting: Obstetrics and Gynecology

## 2021-01-07 ENCOUNTER — Inpatient Hospital Stay (HOSPITAL_COMMUNITY): Payer: 59 | Admitting: Anesthesiology

## 2021-01-07 DIAGNOSIS — O26893 Other specified pregnancy related conditions, third trimester: Secondary | ICD-10-CM | POA: Diagnosis present

## 2021-01-07 DIAGNOSIS — Z87891 Personal history of nicotine dependence: Secondary | ICD-10-CM | POA: Diagnosis not present

## 2021-01-07 DIAGNOSIS — Z20822 Contact with and (suspected) exposure to covid-19: Secondary | ICD-10-CM | POA: Diagnosis present

## 2021-01-07 DIAGNOSIS — O34211 Maternal care for low transverse scar from previous cesarean delivery: Secondary | ICD-10-CM | POA: Diagnosis present

## 2021-01-07 DIAGNOSIS — O34219 Maternal care for unspecified type scar from previous cesarean delivery: Secondary | ICD-10-CM | POA: Diagnosis not present

## 2021-01-07 DIAGNOSIS — Z3A39 39 weeks gestation of pregnancy: Secondary | ICD-10-CM | POA: Diagnosis not present

## 2021-01-07 DIAGNOSIS — O99344 Other mental disorders complicating childbirth: Secondary | ICD-10-CM | POA: Diagnosis present

## 2021-01-07 DIAGNOSIS — O4292 Full-term premature rupture of membranes, unspecified as to length of time between rupture and onset of labor: Principal | ICD-10-CM

## 2021-01-07 DIAGNOSIS — F411 Generalized anxiety disorder: Secondary | ICD-10-CM | POA: Diagnosis present

## 2021-01-07 LAB — RESP PANEL BY RT-PCR (FLU A&B, COVID) ARPGX2
Influenza A by PCR: NEGATIVE
Influenza B by PCR: NEGATIVE
SARS Coronavirus 2 by RT PCR: NEGATIVE

## 2021-01-07 LAB — CBC
HCT: 37.8 % (ref 36.0–46.0)
Hemoglobin: 11.9 g/dL — ABNORMAL LOW (ref 12.0–15.0)
MCH: 26.4 pg (ref 26.0–34.0)
MCHC: 31.5 g/dL (ref 30.0–36.0)
MCV: 83.8 fL (ref 80.0–100.0)
Platelets: 317 10*3/uL (ref 150–400)
RBC: 4.51 MIL/uL (ref 3.87–5.11)
RDW: 13.5 % (ref 11.5–15.5)
WBC: 16.7 10*3/uL — ABNORMAL HIGH (ref 4.0–10.5)
nRBC: 0 % (ref 0.0–0.2)

## 2021-01-07 LAB — COMPREHENSIVE METABOLIC PANEL
ALT: 12 U/L (ref 0–44)
AST: 19 U/L (ref 15–41)
Albumin: 3.1 g/dL — ABNORMAL LOW (ref 3.5–5.0)
Alkaline Phosphatase: 170 U/L — ABNORMAL HIGH (ref 38–126)
Anion gap: 9 (ref 5–15)
BUN: 12 mg/dL (ref 6–20)
CO2: 21 mmol/L — ABNORMAL LOW (ref 22–32)
Calcium: 8.9 mg/dL (ref 8.9–10.3)
Chloride: 105 mmol/L (ref 98–111)
Creatinine, Ser: 0.93 mg/dL (ref 0.44–1.00)
GFR, Estimated: >60 mL/min (ref >60)
Glucose, Bld: 94 mg/dL (ref 70–99)
Potassium: 3.7 mmol/L (ref 3.5–5.1)
Sodium: 135 mmol/L (ref 135–145)
Total Bilirubin: 0.3 mg/dL (ref 0.3–1.2)
Total Protein: 6.2 g/dL — ABNORMAL LOW (ref 6.5–8.1)

## 2021-01-07 LAB — TYPE AND SCREEN
ABO/RH(D): O POS
Antibody Screen: NEGATIVE

## 2021-01-07 LAB — AMNISURE RUPTURE OF MEMBRANE (ROM) NOT AT ARMC: Amnisure ROM: POSITIVE

## 2021-01-07 LAB — PROTEIN / CREATININE RATIO, URINE
Creatinine, Urine: 28.77 mg/dL
Total Protein, Urine: <6 mg/dL

## 2021-01-07 LAB — RPR: RPR Ser Ql: NONREACTIVE

## 2021-01-07 LAB — OB RESULTS CONSOLE GBS: GBS: NEGATIVE

## 2021-01-07 MED ORDER — SIMETHICONE 80 MG PO CHEW: 80.0000 mg | CHEWABLE_TABLET | ORAL | Status: DC | PRN

## 2021-01-07 MED ORDER — BENZOCAINE-MENTHOL 20-0.5 % EX AERO
1.0000 "application " | INHALATION_SPRAY | CUTANEOUS | Status: DC | PRN
  Administered 2021-01-07: 1 via TOPICAL
  Filled 2021-01-07: qty 56

## 2021-01-07 MED ORDER — LIDOCAINE HCL (PF) 1 % IJ SOLN
INTRAMUSCULAR | Status: DC | PRN
  Administered 2021-01-07: 2 mL via EPIDURAL
  Administered 2021-01-07: 10 mL via EPIDURAL

## 2021-01-07 MED ORDER — OXYCODONE-ACETAMINOPHEN 5-325 MG PO TABS: 1.0000 | ORAL_TABLET | ORAL | Status: DC | PRN

## 2021-01-07 MED ORDER — EPHEDRINE 5 MG/ML INJ: 10.0000 mg | INTRAVENOUS | Status: DC | PRN

## 2021-01-07 MED ORDER — LIDOCAINE HCL (PF) 1 % IJ SOLN: 30.0000 mL | INTRAMUSCULAR | Status: DC | PRN

## 2021-01-07 MED ORDER — LACTATED RINGERS IV SOLN: INTRAVENOUS | Status: DC

## 2021-01-07 MED ORDER — ONDANSETRON HCL 4 MG/2ML IJ SOLN: 4.0000 mg | INTRAMUSCULAR | Status: DC | PRN

## 2021-01-07 MED ORDER — FENTANYL-BUPIVACAINE-NACL 0.5-0.125-0.9 MG/250ML-% EP SOLN
EPIDURAL | Status: DC | PRN
  Administered 2021-01-07: 12 mL/h via EPIDURAL

## 2021-01-07 MED ORDER — ONDANSETRON HCL 4 MG/2ML IJ SOLN: 4.0000 mg | Freq: Four times a day (QID) | INTRAMUSCULAR | Status: DC | PRN

## 2021-01-07 MED ORDER — FENTANYL-BUPIVACAINE-NACL 0.5-0.125-0.9 MG/250ML-% EP SOLN
12.0000 mL/h | EPIDURAL | Status: DC | PRN
  Filled 2021-01-07: qty 250

## 2021-01-07 MED ORDER — ZOLPIDEM TARTRATE 5 MG PO TABS: 5.0000 mg | ORAL_TABLET | Freq: Every evening | ORAL | Status: DC | PRN

## 2021-01-07 MED ORDER — DIPHENHYDRAMINE HCL 50 MG/ML IJ SOLN: 12.5000 mg | INTRAMUSCULAR | Status: DC | PRN

## 2021-01-07 MED ORDER — METHYLERGONOVINE MALEATE 0.2 MG PO TABS: 0.2000 mg | ORAL_TABLET | ORAL | Status: DC | PRN

## 2021-01-07 MED ORDER — METHYLERGONOVINE MALEATE 0.2 MG/ML IJ SOLN: 0.2000 mg | INTRAMUSCULAR | Status: DC | PRN

## 2021-01-07 MED ORDER — OXYTOCIN BOLUS FROM INFUSION
333.0000 mL | Freq: Once | INTRAVENOUS | Status: AC
  Administered 2021-01-07: 333 mL via INTRAVENOUS

## 2021-01-07 MED ORDER — PHENYLEPHRINE 40 MCG/ML (10ML) SYRINGE FOR IV PUSH (FOR BLOOD PRESSURE SUPPORT)
80.0000 ug | PREFILLED_SYRINGE | INTRAVENOUS | Status: DC | PRN
  Administered 2021-01-07: 80 ug via INTRAVENOUS

## 2021-01-07 MED ORDER — OXYCODONE-ACETAMINOPHEN 5-325 MG PO TABS: 2.0000 | ORAL_TABLET | ORAL | Status: DC | PRN

## 2021-01-07 MED ORDER — OXYTOCIN-SODIUM CHLORIDE 30-0.9 UT/500ML-% IV SOLN
2.5000 [IU]/h | INTRAVENOUS | Status: DC
  Administered 2021-01-07: 2.5 [IU]/h via INTRAVENOUS

## 2021-01-07 MED ORDER — COCONUT OIL OIL: 1.0000 "application " | TOPICAL_OIL | Status: DC | PRN

## 2021-01-07 MED ORDER — TERBUTALINE SULFATE 1 MG/ML IJ SOLN: 0.2500 mg | Freq: Once | INTRAMUSCULAR | Status: DC | PRN

## 2021-01-07 MED ORDER — WITCH HAZEL-GLYCERIN EX PADS: 1.0000 "application " | MEDICATED_PAD | CUTANEOUS | Status: DC | PRN

## 2021-01-07 MED ORDER — IBUPROFEN 600 MG PO TABS
600.0000 mg | ORAL_TABLET | Freq: Four times a day (QID) | ORAL | Status: DC
  Administered 2021-01-07 – 2021-01-08 (×3): 600 mg via ORAL
  Filled 2021-01-07 (×4): qty 1

## 2021-01-07 MED ORDER — TETANUS-DIPHTH-ACELL PERTUSSIS 5-2.5-18.5 LF-MCG/0.5 IM SUSY: 0.5000 mL | PREFILLED_SYRINGE | Freq: Once | INTRAMUSCULAR | Status: DC

## 2021-01-07 MED ORDER — SOD CITRATE-CITRIC ACID 500-334 MG/5ML PO SOLN
30.0000 mL | ORAL | Status: DC | PRN
  Administered 2021-01-07 (×2): 30 mL via ORAL
  Filled 2021-01-07 (×2): qty 30

## 2021-01-07 MED ORDER — FENTANYL CITRATE (PF) 100 MCG/2ML IJ SOLN
50.0000 ug | INTRAMUSCULAR | Status: DC | PRN
  Administered 2021-01-07: 50 ug via INTRAVENOUS
  Filled 2021-01-07: qty 2

## 2021-01-07 MED ORDER — LACTATED RINGERS IV SOLN
500.0000 mL | Freq: Once | INTRAVENOUS | Status: AC
  Administered 2021-01-07: 500 mL via INTRAVENOUS

## 2021-01-07 MED ORDER — ACETAMINOPHEN 325 MG PO TABS
650.0000 mg | ORAL_TABLET | ORAL | Status: DC | PRN
  Administered 2021-01-07: 650 mg via ORAL
  Filled 2021-01-07: qty 2

## 2021-01-07 MED ORDER — SENNOSIDES-DOCUSATE SODIUM 8.6-50 MG PO TABS
2.0000 | ORAL_TABLET | Freq: Every day | ORAL | Status: DC
  Administered 2021-01-08: 2 via ORAL
  Filled 2021-01-07: qty 2

## 2021-01-07 MED ORDER — ACETAMINOPHEN 325 MG PO TABS: 650.0000 mg | ORAL_TABLET | ORAL | Status: DC | PRN

## 2021-01-07 MED ORDER — PHENYLEPHRINE 40 MCG/ML (10ML) SYRINGE FOR IV PUSH (FOR BLOOD PRESSURE SUPPORT)
80.0000 ug | PREFILLED_SYRINGE | INTRAVENOUS | Status: DC | PRN
  Filled 2021-01-07 (×2): qty 10

## 2021-01-07 MED ORDER — LACTATED RINGERS IV SOLN
500.0000 mL | INTRAVENOUS | Status: DC | PRN
  Administered 2021-01-07: 500 mL via INTRAVENOUS

## 2021-01-07 MED ORDER — OXYTOCIN-SODIUM CHLORIDE 30-0.9 UT/500ML-% IV SOLN
1.0000 m[IU]/min | INTRAVENOUS | Status: DC
Start: 2021-01-07 — End: 2021-01-07
  Administered 2021-01-07: 1 m[IU]/min via INTRAVENOUS
  Filled 2021-01-07: qty 500

## 2021-01-07 MED ORDER — FLEET ENEMA 7-19 GM/118ML RE ENEM: 1.0000 | ENEMA | RECTAL | Status: DC | PRN

## 2021-01-07 MED ORDER — CALCIUM CARBONATE ANTACID 500 MG PO CHEW
1.0000 | CHEWABLE_TABLET | Freq: Two times a day (BID) | ORAL | Status: DC
  Administered 2021-01-07: 200 mg via ORAL
  Filled 2021-01-07: qty 1

## 2021-01-07 MED ORDER — PRENATAL MULTIVITAMIN CH
1.0000 | ORAL_TABLET | Freq: Every day | ORAL | Status: DC
  Administered 2021-01-08: 1 via ORAL
  Filled 2021-01-07: qty 1

## 2021-01-07 MED ORDER — DIPHENHYDRAMINE HCL 25 MG PO CAPS: 25.0000 mg | ORAL_CAPSULE | Freq: Four times a day (QID) | ORAL | Status: DC | PRN

## 2021-01-07 MED ORDER — DIBUCAINE (PERIANAL) 1 % EX OINT: 1.0000 "application " | TOPICAL_OINTMENT | CUTANEOUS | Status: DC | PRN

## 2021-01-07 MED ORDER — OXYTOCIN-SODIUM CHLORIDE 30-0.9 UT/500ML-% IV SOLN
1.0000 m[IU]/min | INTRAVENOUS | Status: DC
  Administered 2021-01-07: 2 m[IU]/min via INTRAVENOUS

## 2021-01-07 MED ORDER — ONDANSETRON HCL 4 MG PO TABS: 4.0000 mg | ORAL_TABLET | ORAL | Status: DC | PRN

## 2021-01-07 NOTE — Anesthesia Preprocedure Evaluation (Signed)
Anesthesia Evaluation  Patient identified by MRN, date of birth, ID band Patient awake    Reviewed: Allergy & Precautions, Patient's Chart, lab work & pertinent test results  Airway Mallampati: II  TM Distance: >3 FB Neck ROM: Full    Dental no notable dental hx.    Pulmonary former smoker,    Pulmonary exam normal breath sounds clear to auscultation       Cardiovascular negative cardio ROS Normal cardiovascular exam Rhythm:Regular Rate:Normal     Neuro/Psych PSYCHIATRIC DISORDERS Anxiety Depression negative neurological ROS     GI/Hepatic Neg liver ROS, GERD  Medicated and Controlled,  Endo/Other  negative endocrine ROS  Renal/GU negative Renal ROS  Female GU complaint     Musculoskeletal negative musculoskeletal ROS (+)   Abdominal   Peds negative pediatric ROS (+)  Hematology negative hematology ROS (+) hct 37.8, plt 317   Anesthesia Other Findings   Reproductive/Obstetrics (+) Pregnancy TOLAC, section 2019                             Anesthesia Physical Anesthesia Plan  ASA: 2  Anesthesia Plan: Epidural   Post-op Pain Management:    Induction:   PONV Risk Score and Plan: 2  Airway Management Planned: Natural Airway  Additional Equipment: None  Intra-op Plan:   Post-operative Plan:   Informed Consent: I have reviewed the patients History and Physical, chart, labs and discussed the procedure including the risks, benefits and alternatives for the proposed anesthesia with the patient or authorized representative who has indicated his/her understanding and acceptance.       Plan Discussed with:   Anesthesia Plan Comments:         Anesthesia Quick Evaluation

## 2021-01-07 NOTE — Anesthesia Procedure Notes (Signed)
Epidural Patient location during procedure: OB Start time: 01/07/2021 6:28 AM End time: 01/07/2021 6:38 AM  Staffing Anesthesiologist: Lannie Fields, DO Performed: anesthesiologist   Preanesthetic Checklist Completed: patient identified, IV checked, risks and benefits discussed, monitors and equipment checked, pre-op evaluation and timeout performed  Epidural Patient position: sitting Prep: DuraPrep and site prepped and draped Patient monitoring: continuous pulse ox, blood pressure, heart rate and cardiac monitor Approach: midline Location: L3-L4 Injection technique: LOR air  Needle:  Needle type: Tuohy  Needle gauge: 17 G Needle length: 9 cm Needle insertion depth: 4.5 cm Catheter type: closed end flexible Catheter size: 19 Gauge Catheter at skin depth: 10 cm Test dose: negative  Assessment Sensory level: T8 Events: blood not aspirated, injection not painful, no injection resistance, no paresthesia and negative IV test  Additional Notes Patient identified. Risks/Benefits/Options discussed with patient including but not limited to bleeding, infection, nerve damage, paralysis, failed block, incomplete pain control, headache, blood pressure changes, nausea, vomiting, reactions to medication both or allergic, itching and postpartum back pain. Confirmed with bedside nurse the patient's most recent platelet count. Confirmed with patient that they are not currently taking any anticoagulation, have any bleeding history or any family history of bleeding disorders. Patient expressed understanding and wished to proceed. All questions were answered. Sterile technique was used throughout the entire procedure. Please see nursing notes for vital signs. Test dose was given through epidural catheter and negative prior to continuing to dose epidural or start infusion. Warning signs of high block given to the patient including shortness of breath, tingling/numbness in hands, complete motor  block, or any concerning symptoms with instructions to call for help. Patient was given instructions on fall risk and not to get out of bed. All questions and concerns addressed with instructions to call with any issues or inadequate analgesia.  Reason for block:procedure for pain

## 2021-01-07 NOTE — Progress Notes (Signed)
Breanna Murray is a 30 y.o. [id number] at [redacted]w[redacted]d by LMP admitted for rupture of membranes  Subjective: Comfortable s/p epidural  Objective: BP 104/64   Pulse 85   Temp 98.1 F (36.7 C) (Oral)   Resp 15   Ht 5\' 5"  (1.651 m)   Wt 83.5 kg   SpO2 100%   BMI 30.62 kg/m  No intake/output data recorded. No intake/output data recorded.  FHT:  FHR: 155 bpm, variability: moderate,  accelerations:  Present,  decelerations:  Absent UC:   irregular, every 5 minutes SVE:   2-3/70/-1 IUPC placed without difficulty  Labs: Lab Results  Component Value Date   WBC 16.7 (H) 01/07/2021   HGB 11.9 (L) 01/07/2021   HCT 37.8 01/07/2021   MCV 83.8 01/07/2021   PLT 317 01/07/2021    Assessment / Plan: Augmentation of labor, progressing well TOLAC  Labor: Progressing normally Preeclampsia:  no signs or symptoms of toxicity Fetal Wellbeing:  Category I Pain Control:  Epidural I/D:  n/a Anticipated MOD:  NSVD  Breanna Murray J 01/07/2021, 9:10 AM

## 2021-01-07 NOTE — H&P (Addendum)
Breanna Murray is a 30 y.o. female presenting for SROM at term for TOLAC. OB History     Gravida  4   Para  1   Term  1   Preterm      AB  2   Living  1      SAB  2   IAB      Ectopic      Multiple      Live Births  1          Past Medical History:  Diagnosis Date   Anxiety    Depression    Endometriosis    Past Surgical History:  Procedure Laterality Date   CESAREAN SECTION N/A 06/19/2017   Procedure: CESAREAN SECTION;  Surgeon: Olivia Mackie, MD;  Location: [facility];  Service: Obstetrics;  Laterality: N/A;   CHROMOPERTUBATION Bilateral 05/22/2015   Procedure: CHROMOPERTUBATION;  Surgeon: Mitchel Honour, DO;  Location: [facility];  Service: Gynecology;  Laterality: Bilateral;   DIAGNOSTIC LAPAROSCOPY     LAPAROSCOPY N/A 05/22/2015   Procedure: LAPAROSCOPY DIAGNOSTIC, removal of endometriosis, Bladder Instillation;  Surgeon: Mitchel Honour, DO;  Location: [facility];  Service: Gynecology;  Laterality: N/A;   TONSILLECTOMY     Family History: family history includes Hypertension in her father and mother. Social History:  reports that she quit smoking about 10 years ago. Her smoking use included cigarettes. She has never used smokeless tobacco. She reports that she does not currently use alcohol after a past usage of about 3.0 standard drinks per week. She reports that she does not use drugs.     Maternal Diabetes: No Genetic Screening: Normal Maternal Ultrasounds/Referrals: Normal Fetal Ultrasounds or other Referrals:  None Maternal Substance Abuse:  No Significant Maternal Medications:     wellbutrin Significant Maternal Lab Results:  Group B Strep negative Other Comments:  None  Review of Systems  Constitutional: Negative.   All other systems reviewed and are negative. Maternal Medical History:  Reason for admission: Rupture of membranes.   Contractions: Onset was 3-5 hours ago.   Frequency: irregular.   Perceived severity is moderate.   Fetal  activity: Perceived fetal activity is normal.   Last perceived fetal movement was within the past hour.   Prenatal complications: no prenatal complications Prenatal Complications - Diabetes: none.  Dilation: 1 Effacement (%): Thick Station: -2 Exam by:: Berle Mull RN Blood pressure 135/87, pulse 97, temperature 98.1 F (36.7 C), temperature source Oral, resp. rate 18, height 5\' 5"  (1.651 m), weight 83.5 kg, SpO2 99 %, unknown if currently breastfeeding. Maternal Exam:  Uterine Assessment: Contraction strength is mild.  Contraction frequency is irregular.  Abdomen: Patient reports no abdominal tenderness. Surgical scars: low transverse.   Fetal presentation: vertex Introitus: Normal vulva. Normal vagina.  Ferning test: positive.  Nitrazine test: positive. Amniotic fluid character: clear. Pelvis: questionable for delivery.   Cervix: Cervix evaluated by digital exam.    Physical Exam Constitutional:      Appearance: Normal appearance. She is normal weight.  HENT:     Head: Normocephalic and atraumatic.  Cardiovascular:     Rate and Rhythm: Normal rate and regular rhythm.     Pulses: Normal pulses.     Heart sounds: Normal heart sounds.  Pulmonary:     Effort: Pulmonary effort is normal.     Breath sounds: Normal breath sounds.  Abdominal:     General: Bowel sounds are normal.     Palpations: Abdomen is soft.  Genitourinary:  General: Normal vulva.  Musculoskeletal:        General: Normal range of motion.     Cervical back: Normal range of motion and neck supple.  Skin:    General: Skin is warm and dry.  Neurological:     General: No focal deficit present.     Mental Status: She is alert and oriented to person, place, and time.  Psychiatric:        Behavior: Behavior normal.    Prenatal labs: ABO, Rh: --/--/O POS (11/10 6184) Antibody: NEG (11/10 0049) Rubella:  imm RPR:   neg HBsAg:   neg HIV:   neg GBS:   neg  Assessment/Plan: Term IUP with  SROM TOLAC   Verona Hartshorn J 01/07/2021, 6:36 AM

## 2021-01-08 LAB — CBC
HCT: 30.3 % — ABNORMAL LOW (ref 36.0–46.0)
Hemoglobin: 10.1 g/dL — ABNORMAL LOW (ref 12.0–15.0)
MCH: 27.5 pg (ref 26.0–34.0)
MCHC: 33.3 g/dL (ref 30.0–36.0)
MCV: 82.6 fL (ref 80.0–100.0)
Platelets: 239 10*3/uL (ref 150–400)
RBC: 3.67 MIL/uL — ABNORMAL LOW (ref 3.87–5.11)
RDW: 13.5 % (ref 11.5–15.5)
WBC: 17.6 10*3/uL — ABNORMAL HIGH (ref 4.0–10.5)
nRBC: 0 % (ref 0.0–0.2)

## 2021-01-08 MED ORDER — ACETAMINOPHEN 500 MG PO TABS: 1000.0000 mg | ORAL_TABLET | Freq: Four times a day (QID) | ORAL | 2 refills | Status: AC | PRN

## 2021-01-08 MED ORDER — IBUPROFEN 600 MG PO TABS: 600.0000 mg | ORAL_TABLET | Freq: Four times a day (QID) | ORAL | 0 refills | Status: AC

## 2021-01-08 MED ORDER — BENZOCAINE-MENTHOL 20-0.5 % EX AERO
1.0000 | INHALATION_SPRAY | CUTANEOUS | Status: AC | PRN
Start: 2021-01-08 — End: ?

## 2021-01-08 NOTE — Progress Notes (Incomplete)
Colon Flattery notified of BP of 134/86. She stated she will be up to see patient

## 2021-01-08 NOTE — Discharge Summary (Signed)
OB Discharge Summary  Patient Name: Breanna Murray DOB: August 07, 1990 MRN: [id number]  Date of admission: 01/06/2021 Delivering provider: Olivia Mackie   Admitting diagnosis: Normal labor and delivery [O80] Intrauterine pregnancy: [redacted]w[redacted]d     Secondary diagnosis: Patient Active Problem List   Diagnosis Date Noted   Normal labor and delivery 01/07/2021   VBAC, delivered 01/07/2021   Obstetrical laceration - R periurethral 01/07/2021   Fetal demise before 20 weeks with retention of dead fetus 03/05/2019   GAD (generalized anxiety disorder) 01/07/2018   Postpartum care following VBAC 11/10 06/19/2017   Additional problems:none   Date of discharge: 01/08/2021   Discharge diagnosis: Principal Problem:   Postpartum care following VBAC 11/10 Active Problems:   GAD (generalized anxiety disorder)   Normal labor and delivery   VBAC, delivered   Obstetrical laceration - R periurethral                                                              Post partum procedures: none  Augmentation: Pitocin Pain control: Epidural  Laceration:Periurethral  Episiotomy:None  Complications: None  Hospital course:  Induction of Labor With Vaginal Delivery   30 y.o. yo [id number] at [redacted]w[redacted]d was admitted to the hospital 01/06/2021 for induction of labor.  Indication for induction: Favorable cervix at term.  Patient had an uncomplicated labor course as follows: Membrane Rupture Time/Date: 10:00 PM ,01/06/2021   Delivery Method:Vaginal, Spontaneous  Episiotomy: None  Lacerations:  Periurethral  Details of delivery can be found in separate delivery note.  Patient had a routine postpartum course. Patient is discharged home 01/08/21.  Newborn Data: Birth date:01/07/2021  Birth time:2:48 PM  Gender:Female  Living status:Living  Apgars:8 ,9  Weight:3260 g   Physical exam  Vitals:   01/07/21 2230 01/08/21 0230 01/08/21 0630 01/08/21 0900  BP: 125/78 131/85 128/86 134/86  Pulse: 80 83 83 80  Resp: 18 16  18    Temp: 98.7 F (37.1 C) 98 F (36.7 C) 98.1 F (36.7 C)   TempSrc:  Oral Oral   SpO2: 100% 97% 99%   Weight:      Height:       General: alert, cooperative, and no distress Lochia: appropriate Uterine Fundus: firm Incision: N/A Perineum: repair intact, no edema DVT Evaluation: No cords or calf tenderness. No significant calf/ankle edema. Labs: Lab Results  Component Value Date   WBC 17.6 (H) 01/08/2021   HGB 10.1 (L) 01/08/2021   HCT 30.3 (L) 01/08/2021   MCV 82.6 01/08/2021   PLT 239 01/08/2021   CMP Latest Ref Rng & Units 01/07/2021  Glucose 70 - 99 mg/dL 94  BUN 6 - 20 mg/dL 12  Creatinine 13/11/2020 - 1.10 mg/dL 3.15  Sodium 9.45 - 859 mmol/L 135  Potassium 3.5 - 5.1 mmol/L 3.7  Chloride 98 - 111 mmol/L 105  CO2 22 - 32 mmol/L 21(L)  Calcium 8.9 - 10.3 mg/dL 8.9  Total Protein 6.5 - 8.1 g/dL 6.2(L)  Total Bilirubin 0.3 - 1.2 mg/dL 0.3  Alkaline Phos 38 - 126 U/L 170(H)  AST 15 - 41 U/L 19  ALT 0 - 44 U/L 12   Edinburgh Postnatal Depression Scale Screening Tool 01/08/2021 06/20/2017  I have been able to laugh and see the funny side of things. 0 0  I  have looked forward with enjoyment to things. 0 0  I have blamed myself unnecessarily when things went wrong. 1 1  I have been anxious or worried for no good reason. 0 1  I have felt scared or panicky for no good reason. 1 1  Things have been getting on top of me. 1 0  I have been so unhappy that I have had difficulty sleeping. 1 0  I have felt sad or miserable. 0 0  I have been so unhappy that I have been crying. 0 0  The thought of harming myself has occurred to me. 0 0  Edinburgh Postnatal Depression Scale Total 4 3   Vaccines: TDaP          UTD        COVID-19   declined        Flu             declined  Discharge instruction:  per After Visit Summary,  [facility] OB booklet and  "Understanding Mother & Baby Care" hospital booklet  After Visit Meds:  Allergies as of 01/08/2021       Reactions   Bee Venom     Metaxalone Hives   Compazine [prochlorperazine Edisylate] Anxiety   Jumping out of body    Dexamethasone Anxiety        Medication List     TAKE these medications    acetaminophen 500 MG tablet Commonly known as: TYLENOL Take 2 tablets (1,000 mg total) by mouth every 6 (six) hours as needed.   benzocaine-Menthol 20-0.5 % Aero Commonly known as: DERMOPLAST Apply 1 application topically as needed for irritation (perineal discomfort).   buPROPion 150 MG 24 hr tablet Commonly known as: WELLBUTRIN XL Take 150 mg by mouth daily.   famotidine 10 MG tablet Commonly known as: PEPCID Take 10 mg by mouth 2 (two) times daily.   ibuprofen 600 MG tablet Commonly known as: ADVIL Take 1 tablet (600 mg total) by mouth every 6 (six) hours.   prenatal multivitamin Tabs tablet Take 1 tablet by mouth daily at 12 noon.               Discharge Care Instructions  (From admission, onward)           Start     Ordered   01/08/21 0000  Discharge wound care:       Comments: Sitz baths 2 times /day with warm water x 1 week. May add herbals: 1 ounce dried comfrey leaf* 1 ounce calendula flowers 1 ounce lavender flowers  Supplies can be found online at [employer] sources at [employer], [employer]  1/2 ounce dried uva ursi leaves 1/2 ounce witch hazel blossoms (if you can find them) 1/2 ounce dried sage leaf 1/2 cup sea salt Directions: Bring 2 quarts of water to a boil. Turn off heat, and place 1 ounce (approximately 1 large handful) of the above mixed herbs (not the salt) into the pot. Steep, covered, for 30 minutes.  Strain the liquid well with a fine mesh strainer, and discard the herb material. Add 2 quarts of liquid to the tub, along with the 1/2 cup of salt. This medicinal liquid can also be made into compresses and peri-rinses.   01/08/21 1139            Diet: routine diet  Activity: Advance as tolerated. Pelvic rest for 6 weeks.   Postpartum  contraception: TBA in office  Newborn Data: Live born  female  Birth Weight: 7 lb 3 oz (3260 g) APGAR: 8, 9  Newborn Delivery   Birth date/time: 01/07/2021 14:48:00 Delivery type: Vaginal, Spontaneous      named Harrison Mons Baby Feeding: Bottle Disposition:home with mother   Delivery Report:  Review the Delivery Report for details.    Follow up:  Follow-up Information     Olivia Mackie, MD Follow up.   Specialty: Obstetrics and Gynecology Contact information: [address] [place] [address] [phone]                   Signed: Cipriano Mile, MSN 01/08/2021, 11:39 AM

## 2021-01-08 NOTE — Social Work (Signed)
MOB was referred for history of depression/anxiety.  * Referral screened out by Clinical Social Worker because none of the following criteria appear to apply: ~ History of anxiety/depression during this pregnancy, or of post-partum depression following prior delivery. ~ Diagnosis of anxiety and/or depression within last 3 years OR * MOB's symptoms currently being treated with medication and/or therapy. Per chart review, MOB actively takes Wellbutrin for symptoms.   Please contact the Clinical Social Worker if needs arise, by [facility] request, or if MOB scores greater than 9/yes to question 10 on Edinburgh Postpartum Depression Screen.   Breanna Murray, MSW, LCSW [facility]  Clinical Social Worker  [phone] 2020-03-08  8:25 AM

## 2021-01-08 NOTE — Anesthesia Postprocedure Evaluation (Signed)
Anesthesia Post Note  Patient: Breanna Murray  Procedure(s) Performed: AN AD HOC LABOR EPIDURAL     Patient location during evaluation: Mother Baby Anesthesia Type: Epidural Level of consciousness: awake and alert Pain management: pain level controlled Vital Signs Assessment: post-procedure vital signs reviewed and stable Respiratory status: spontaneous breathing, nonlabored ventilation and respiratory function stable Cardiovascular status: stable Postop Assessment: no headache, no backache, epidural receding, no apparent nausea or vomiting, patient able to bend at knees, adequate PO intake and able to ambulate Anesthetic complications: no   No notable events documented.  Last Vitals:  Vitals:   01/08/21 0230 01/08/21 0630  BP: 131/85 128/86  Pulse: 83 83  Resp: 16 18  Temp: 36.7 C 36.7 C  SpO2: 97% 99%    Last Pain:  Vitals:   01/08/21 0630  TempSrc: Oral  PainSc:    Pain Goal: Patients Stated Pain Goal: 0 (01/07/21 1337)                 Laban Emperor

## 2021-01-12 ENCOUNTER — Inpatient Hospital Stay (HOSPITAL_COMMUNITY): Payer: PRIVATE HEALTH INSURANCE

## 2021-01-12 ENCOUNTER — Inpatient Hospital Stay (HOSPITAL_COMMUNITY)
Admission: AD | Admit: 2021-01-12 | Payer: PRIVATE HEALTH INSURANCE | Source: Home / Self Care | Admitting: Obstetrics and Gynecology

## 2021-01-19 ENCOUNTER — Telehealth (HOSPITAL_COMMUNITY): Payer: Self-pay

## 2021-01-19 NOTE — Telephone Encounter (Signed)
No answer. Left message to return nurse call.  Marcelino Duster [facility] 01/19/2021,1451

## 2021-07-14 ENCOUNTER — Institutional Professional Consult (permissible substitution): Payer: 59 | Admitting: Plastic Surgery
